# Patient Record
Sex: Male | Born: 1975 | Race: White | Hispanic: No | Marital: Single
Health system: Southern US, Community
[De-identification: ages and names within clinical notes are randomized; demographics above are authoritative.]

## PROBLEM LIST (undated history)

## (undated) DIAGNOSIS — K469 Unspecified abdominal hernia without obstruction or gangrene: Secondary | ICD-10-CM

---

## 1998-01-16 ENCOUNTER — Emergency Department (HOSPITAL_COMMUNITY): Admission: EM | Admit: 1998-01-16 | Discharge: 1998-01-17 | Payer: Self-pay | Admitting: Emergency Medicine

## 1998-05-01 ENCOUNTER — Emergency Department (HOSPITAL_COMMUNITY): Admission: EM | Admit: 1998-05-01 | Discharge: 1998-05-01 | Payer: Self-pay

## 1998-06-14 ENCOUNTER — Emergency Department (HOSPITAL_COMMUNITY): Admission: EM | Admit: 1998-06-14 | Discharge: 1998-06-15 | Payer: Self-pay | Admitting: Emergency Medicine

## 1998-07-26 ENCOUNTER — Emergency Department (HOSPITAL_COMMUNITY): Admission: EM | Admit: 1998-07-26 | Discharge: 1998-07-26 | Payer: Self-pay

## 1998-08-06 ENCOUNTER — Emergency Department (HOSPITAL_COMMUNITY): Admission: EM | Admit: 1998-08-06 | Discharge: 1998-08-06 | Payer: Self-pay | Admitting: Emergency Medicine

## 1999-02-26 ENCOUNTER — Encounter: Payer: Self-pay | Admitting: Emergency Medicine

## 1999-02-26 ENCOUNTER — Emergency Department (HOSPITAL_COMMUNITY): Admission: EM | Admit: 1999-02-26 | Discharge: 1999-02-26 | Payer: Self-pay | Admitting: Emergency Medicine

## 1999-10-29 ENCOUNTER — Emergency Department (HOSPITAL_COMMUNITY): Admission: EM | Admit: 1999-10-29 | Discharge: 1999-10-29 | Payer: Self-pay | Admitting: Emergency Medicine

## 2000-04-02 ENCOUNTER — Emergency Department (HOSPITAL_COMMUNITY): Admission: EM | Admit: 2000-04-02 | Discharge: 2000-04-02 | Payer: Self-pay | Admitting: Emergency Medicine

## 2000-04-02 ENCOUNTER — Encounter: Payer: Self-pay | Admitting: Emergency Medicine

## 2000-05-17 ENCOUNTER — Emergency Department (HOSPITAL_COMMUNITY): Admission: EM | Admit: 2000-05-17 | Discharge: 2000-05-17 | Payer: Self-pay | Admitting: Emergency Medicine

## 2001-09-23 ENCOUNTER — Emergency Department (HOSPITAL_COMMUNITY): Admission: EM | Admit: 2001-09-23 | Discharge: 2001-09-23 | Payer: Self-pay

## 2001-12-20 ENCOUNTER — Encounter: Payer: Self-pay | Admitting: Emergency Medicine

## 2001-12-20 ENCOUNTER — Emergency Department (HOSPITAL_COMMUNITY): Admission: EM | Admit: 2001-12-20 | Discharge: 2001-12-20 | Payer: Self-pay | Admitting: Emergency Medicine

## 2004-05-25 ENCOUNTER — Emergency Department (HOSPITAL_COMMUNITY): Admission: EM | Admit: 2004-05-25 | Discharge: 2004-05-25 | Payer: Self-pay | Admitting: Emergency Medicine

## 2004-10-21 ENCOUNTER — Emergency Department (HOSPITAL_COMMUNITY): Admission: EM | Admit: 2004-10-21 | Discharge: 2004-10-21 | Payer: Self-pay | Admitting: Emergency Medicine

## 2005-05-22 ENCOUNTER — Emergency Department (HOSPITAL_COMMUNITY): Admission: EM | Admit: 2005-05-22 | Discharge: 2005-05-22 | Payer: Self-pay | Admitting: *Deleted

## 2006-08-09 ENCOUNTER — Emergency Department (HOSPITAL_COMMUNITY): Admission: EM | Admit: 2006-08-09 | Discharge: 2006-08-09 | Payer: Self-pay | Admitting: Emergency Medicine

## 2007-10-25 ENCOUNTER — Emergency Department (HOSPITAL_COMMUNITY): Admission: EM | Admit: 2007-10-25 | Discharge: 2007-10-25 | Payer: Self-pay | Admitting: Emergency Medicine

## 2008-01-30 ENCOUNTER — Emergency Department (HOSPITAL_COMMUNITY): Admission: EM | Admit: 2008-01-30 | Discharge: 2008-01-30 | Payer: Self-pay | Admitting: Emergency Medicine

## 2008-02-09 ENCOUNTER — Emergency Department (HOSPITAL_COMMUNITY): Admission: EM | Admit: 2008-02-09 | Discharge: 2008-02-09 | Payer: Self-pay | Admitting: Emergency Medicine

## 2008-06-25 ENCOUNTER — Emergency Department (HOSPITAL_COMMUNITY): Admission: EM | Admit: 2008-06-25 | Discharge: 2008-06-25 | Payer: Self-pay | Admitting: Gastroenterology

## 2008-07-22 ENCOUNTER — Emergency Department (HOSPITAL_COMMUNITY): Admission: EM | Admit: 2008-07-22 | Discharge: 2008-07-22 | Payer: Self-pay | Admitting: Emergency Medicine

## 2008-08-22 ENCOUNTER — Emergency Department (HOSPITAL_COMMUNITY): Admission: EM | Admit: 2008-08-22 | Discharge: 2008-08-22 | Payer: Self-pay | Admitting: Emergency Medicine

## 2008-09-09 ENCOUNTER — Emergency Department (HOSPITAL_COMMUNITY): Admission: EM | Admit: 2008-09-09 | Discharge: 2008-09-09 | Payer: Self-pay | Admitting: Emergency Medicine

## 2008-09-28 ENCOUNTER — Emergency Department (HOSPITAL_COMMUNITY): Admission: EM | Admit: 2008-09-28 | Discharge: 2008-09-28 | Payer: Self-pay | Admitting: Emergency Medicine

## 2009-04-30 ENCOUNTER — Emergency Department (HOSPITAL_COMMUNITY): Admission: EM | Admit: 2009-04-30 | Discharge: 2009-04-30 | Payer: Self-pay | Admitting: Emergency Medicine

## 2010-03-23 ENCOUNTER — Emergency Department (HOSPITAL_COMMUNITY): Admission: EM | Admit: 2010-03-23 | Discharge: 2010-03-23 | Payer: Self-pay | Admitting: Emergency Medicine

## 2010-10-06 ENCOUNTER — Emergency Department (HOSPITAL_COMMUNITY): Payer: Self-pay

## 2010-10-06 ENCOUNTER — Emergency Department (HOSPITAL_COMMUNITY)
Admission: EM | Admit: 2010-10-06 | Discharge: 2010-10-06 | Disposition: A | Discharge status: Home / Self Care | Payer: Self-pay | Attending: Emergency Medicine | Admitting: Emergency Medicine

## 2010-10-06 DIAGNOSIS — J069 Acute upper respiratory infection, unspecified: Secondary | ICD-10-CM | POA: Insufficient documentation

## 2010-10-06 DIAGNOSIS — R5381 Other malaise: Secondary | ICD-10-CM | POA: Insufficient documentation

## 2010-10-06 DIAGNOSIS — R070 Pain in throat: Secondary | ICD-10-CM | POA: Insufficient documentation

## 2010-10-06 DIAGNOSIS — R197 Diarrhea, unspecified: Secondary | ICD-10-CM | POA: Insufficient documentation

## 2010-10-06 DIAGNOSIS — R0602 Shortness of breath: Secondary | ICD-10-CM | POA: Insufficient documentation

## 2010-10-06 DIAGNOSIS — R05 Cough: Secondary | ICD-10-CM | POA: Insufficient documentation

## 2010-10-06 DIAGNOSIS — R509 Fever, unspecified: Secondary | ICD-10-CM | POA: Insufficient documentation

## 2010-10-06 DIAGNOSIS — R599 Enlarged lymph nodes, unspecified: Secondary | ICD-10-CM | POA: Insufficient documentation

## 2010-10-06 DIAGNOSIS — R1110 Vomiting, unspecified: Secondary | ICD-10-CM | POA: Insufficient documentation

## 2010-10-06 DIAGNOSIS — J3489 Other specified disorders of nose and nasal sinuses: Secondary | ICD-10-CM | POA: Insufficient documentation

## 2010-10-06 DIAGNOSIS — R059 Cough, unspecified: Secondary | ICD-10-CM | POA: Insufficient documentation

## 2010-10-06 DIAGNOSIS — R5383 Other fatigue: Secondary | ICD-10-CM | POA: Insufficient documentation

## 2010-10-06 LAB — RAPID STREP SCREEN (MED CTR MEBANE ONLY): Streptococcus, Group A Screen (Direct): NEGATIVE

## 2010-11-10 ENCOUNTER — Emergency Department (HOSPITAL_COMMUNITY): Payer: Self-pay

## 2010-11-10 ENCOUNTER — Emergency Department (HOSPITAL_COMMUNITY)
Admission: EM | Admit: 2010-11-10 | Discharge: 2010-11-10 | Disposition: A | Discharge status: Home / Self Care | Payer: Self-pay | Attending: Emergency Medicine | Admitting: Emergency Medicine

## 2010-11-10 DIAGNOSIS — W108XXA Fall (on) (from) other stairs and steps, initial encounter: Secondary | ICD-10-CM | POA: Insufficient documentation

## 2010-11-10 DIAGNOSIS — S20229A Contusion of unspecified back wall of thorax, initial encounter: Secondary | ICD-10-CM | POA: Insufficient documentation

## 2010-11-10 DIAGNOSIS — S335XXA Sprain of ligaments of lumbar spine, initial encounter: Secondary | ICD-10-CM | POA: Insufficient documentation

## 2010-11-10 DIAGNOSIS — Y92009 Unspecified place in unspecified non-institutional (private) residence as the place of occurrence of the external cause: Secondary | ICD-10-CM | POA: Insufficient documentation

## 2010-11-15 ENCOUNTER — Emergency Department (HOSPITAL_COMMUNITY)
Admission: EM | Admit: 2010-11-15 | Discharge: 2010-11-15 | Discharge status: Against Medical Advice | Payer: No Typology Code available for payment source | Attending: Emergency Medicine | Admitting: Emergency Medicine

## 2010-11-15 DIAGNOSIS — Z0389 Encounter for observation for other suspected diseases and conditions ruled out: Secondary | ICD-10-CM | POA: Insufficient documentation

## 2010-11-16 ENCOUNTER — Emergency Department (HOSPITAL_COMMUNITY): Payer: No Typology Code available for payment source

## 2010-11-16 ENCOUNTER — Emergency Department (HOSPITAL_COMMUNITY)
Admission: EM | Admit: 2010-11-16 | Discharge: 2010-11-16 | Disposition: A | Discharge status: Home / Self Care | Payer: No Typology Code available for payment source | Attending: Emergency Medicine | Admitting: Emergency Medicine

## 2010-11-16 DIAGNOSIS — Y92410 Unspecified street and highway as the place of occurrence of the external cause: Secondary | ICD-10-CM | POA: Insufficient documentation

## 2010-11-16 DIAGNOSIS — S62639A Displaced fracture of distal phalanx of unspecified finger, initial encounter for closed fracture: Secondary | ICD-10-CM | POA: Insufficient documentation

## 2010-12-03 LAB — URINALYSIS, ROUTINE W REFLEX MICROSCOPIC
Bilirubin Urine: NEGATIVE
Glucose, UA: NEGATIVE mg/dL
Hgb urine dipstick: NEGATIVE
Ketones, ur: NEGATIVE mg/dL
Nitrite: NEGATIVE
Specific Gravity, Urine: 1.006 (ref 1.005–1.030)
pH: 6 (ref 5.0–8.0)

## 2011-02-16 ENCOUNTER — Emergency Department (HOSPITAL_COMMUNITY)
Admission: EM | Admit: 2011-02-16 | Discharge: 2011-02-16 | Disposition: A | Discharge status: Home / Self Care | Payer: Self-pay | Attending: Emergency Medicine | Admitting: Emergency Medicine

## 2011-02-16 ENCOUNTER — Emergency Department (HOSPITAL_COMMUNITY): Payer: Self-pay

## 2011-02-16 DIAGNOSIS — Y9318 Activity, surfing, windsurfing and boogie boarding: Secondary | ICD-10-CM | POA: Insufficient documentation

## 2011-02-16 DIAGNOSIS — IMO0002 Reserved for concepts with insufficient information to code with codable children: Secondary | ICD-10-CM | POA: Insufficient documentation

## 2011-02-16 DIAGNOSIS — X58XXXA Exposure to other specified factors, initial encounter: Secondary | ICD-10-CM | POA: Insufficient documentation

## 2011-02-16 DIAGNOSIS — M25519 Pain in unspecified shoulder: Secondary | ICD-10-CM | POA: Insufficient documentation

## 2011-03-01 ENCOUNTER — Emergency Department (HOSPITAL_COMMUNITY)
Admission: EM | Admit: 2011-03-01 | Discharge: 2011-03-01 | Disposition: A | Discharge status: Home / Self Care | Payer: Self-pay | Attending: Emergency Medicine | Admitting: Emergency Medicine

## 2011-03-01 DIAGNOSIS — M25519 Pain in unspecified shoulder: Secondary | ICD-10-CM | POA: Insufficient documentation

## 2011-03-07 ENCOUNTER — Other Ambulatory Visit: Payer: Self-pay | Admitting: Orthopedic Surgery

## 2011-03-07 DIAGNOSIS — M25511 Pain in right shoulder: Secondary | ICD-10-CM

## 2011-03-21 ENCOUNTER — Other Ambulatory Visit: Payer: Self-pay | Admitting: Orthopedic Surgery

## 2011-03-21 DIAGNOSIS — M25511 Pain in right shoulder: Secondary | ICD-10-CM

## 2011-03-24 ENCOUNTER — Other Ambulatory Visit: Payer: Self-pay

## 2011-03-24 ENCOUNTER — Inpatient Hospital Stay: Admission: RE | Admit: 2011-03-24 | Payer: Self-pay | Source: Ambulatory Visit

## 2011-10-24 ENCOUNTER — Emergency Department (HOSPITAL_COMMUNITY)
Admission: EM | Admit: 2011-10-24 | Discharge: 2011-10-24 | Disposition: A | Discharge status: Home / Self Care | Payer: Self-pay | Attending: Emergency Medicine | Admitting: Emergency Medicine

## 2011-10-24 ENCOUNTER — Emergency Department (HOSPITAL_COMMUNITY): Payer: Self-pay

## 2011-10-24 ENCOUNTER — Encounter (HOSPITAL_COMMUNITY): Payer: Self-pay

## 2011-10-24 DIAGNOSIS — R404 Transient alteration of awareness: Secondary | ICD-10-CM | POA: Insufficient documentation

## 2011-10-24 DIAGNOSIS — R609 Edema, unspecified: Secondary | ICD-10-CM | POA: Insufficient documentation

## 2011-10-24 DIAGNOSIS — S42009A Fracture of unspecified part of unspecified clavicle, initial encounter for closed fracture: Secondary | ICD-10-CM | POA: Insufficient documentation

## 2011-10-24 DIAGNOSIS — R1110 Vomiting, unspecified: Secondary | ICD-10-CM | POA: Insufficient documentation

## 2011-10-24 DIAGNOSIS — R51 Headache: Secondary | ICD-10-CM | POA: Insufficient documentation

## 2011-10-24 DIAGNOSIS — M79646 Pain in unspecified finger(s): Secondary | ICD-10-CM

## 2011-10-24 DIAGNOSIS — J3489 Other specified disorders of nose and nasal sinuses: Secondary | ICD-10-CM | POA: Insufficient documentation

## 2011-10-24 DIAGNOSIS — IMO0002 Reserved for concepts with insufficient information to code with codable children: Secondary | ICD-10-CM | POA: Insufficient documentation

## 2011-10-24 DIAGNOSIS — M79609 Pain in unspecified limb: Secondary | ICD-10-CM | POA: Insufficient documentation

## 2011-10-24 HISTORY — DX: Unspecified abdominal hernia without obstruction or gangrene: K46.9

## 2011-10-24 MED ORDER — OXYCODONE-ACETAMINOPHEN 5-325 MG PO TABS
1.0000 | ORAL_TABLET | Freq: Once | ORAL | Status: AC
  Administered 2011-10-24: 1 via ORAL
  Filled 2011-10-24: qty 1

## 2011-10-24 MED ORDER — IBUPROFEN 800 MG PO TABS
800.0000 mg | ORAL_TABLET | Freq: Once | ORAL | Status: AC
  Administered 2011-10-24: 800 mg via ORAL
  Filled 2011-10-24: qty 1

## 2011-10-24 MED ORDER — OXYCODONE-ACETAMINOPHEN 5-325 MG PO TABS: 2.0000 | ORAL_TABLET | ORAL | Status: AC | PRN

## 2011-10-24 NOTE — Discharge Instructions (Signed)
Mr Brandon Flowers the x-rays today show a fractured clavicle.  The CT of the head did not show any acute findings. Follow up with the orthopedic physician listed.  Keep ice on the area for the next 24 hours.  Return for severe pain, SOB or high fever/nv.      Clavicle Fracture A clavicle fracture is a broken clavicle (collarbone). The clavicle connects the chest to the shoulder. Most broken clavicles are treated with an arm sling. HOME CARE  Put ice on the injured area.   Put ice in a plastic bag.   Place a towel between the skin and the bag.   Leave the ice on for 15 to 20 minutes, 3 to 4 times a day. Do this for the first 2 days.   Wear the sling or splint for as long as told by your doctor. You may remove the sling or splint for bathing or showering. Keep the shoulder in the same place as when the sling or splint is on. Do not lift the arm.   Allow enough room to place the index finger between the body and strap of the splint. Loosen the splint right away if you lose feeling (numbness) or have tingling in the hands.   Only take medicine as told by your doctor.   Avoid activities that increase pain for 4 to 6 weeks, or as told by your doctor.  GET HELP RIGHT AWAY IF:   There is pain and puffiness (swelling) that is not helped with medicine.   The arm is numb, cold, or pale, even when the sling or splint is loose.  MAKE SURE YOU:   Understand these instructions.   Will watch this condition.   Will get help right away if you or your child is not doing well or gets worse.  Document Released: 01/21/2008 Document Revised: 07/24/2011 Document Reviewed: 05/22/2009 [facility] Patient Information 2012 [place], [place].
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
## 2011-10-24 NOTE — ED Notes (Signed)
NP at bedside.

## 2011-10-25 NOTE — ED Provider Notes (Signed)
Medical screening examination/treatment/procedure(s) were performed by non-physician practitioner and as supervising physician I was immediately available for consultation/collaboration.   Forbes Cellar, MD 10/25/11 1555

## 2012-10-19 ENCOUNTER — Emergency Department (HOSPITAL_COMMUNITY)
Admission: EM | Admit: 2012-10-19 | Discharge: 2012-10-19 | Disposition: A | Discharge status: Home / Self Care | Payer: Self-pay | Attending: Emergency Medicine | Admitting: Emergency Medicine

## 2012-10-19 ENCOUNTER — Emergency Department (HOSPITAL_COMMUNITY): Payer: Self-pay

## 2012-10-19 ENCOUNTER — Encounter (HOSPITAL_COMMUNITY): Payer: Self-pay | Admitting: Emergency Medicine

## 2012-10-19 DIAGNOSIS — Y929 Unspecified place or not applicable: Secondary | ICD-10-CM | POA: Insufficient documentation

## 2012-10-19 DIAGNOSIS — R229 Localized swelling, mass and lump, unspecified: Secondary | ICD-10-CM | POA: Insufficient documentation

## 2012-10-19 DIAGNOSIS — Y9389 Activity, other specified: Secondary | ICD-10-CM | POA: Insufficient documentation

## 2012-10-19 DIAGNOSIS — Z8719 Personal history of other diseases of the digestive system: Secondary | ICD-10-CM | POA: Insufficient documentation

## 2012-10-19 DIAGNOSIS — Z23 Encounter for immunization: Secondary | ICD-10-CM | POA: Insufficient documentation

## 2012-10-19 DIAGNOSIS — F17200 Nicotine dependence, unspecified, uncomplicated: Secondary | ICD-10-CM | POA: Insufficient documentation

## 2012-10-19 DIAGNOSIS — M795 Residual foreign body in soft tissue: Secondary | ICD-10-CM

## 2012-10-19 DIAGNOSIS — IMO0002 Reserved for concepts with insufficient information to code with codable children: Secondary | ICD-10-CM | POA: Insufficient documentation

## 2012-10-19 MED ORDER — CEPHALEXIN 500 MG PO CAPS: 500.0000 mg | ORAL_CAPSULE | Freq: Four times a day (QID) | ORAL | Status: DC

## 2012-10-19 MED ORDER — CEPHALEXIN 500 MG PO CAPS
1000.0000 mg | ORAL_CAPSULE | Freq: Once | ORAL | Status: AC
  Administered 2012-10-19: 1000 mg via ORAL
  Filled 2012-10-19: qty 2

## 2012-10-19 MED ORDER — TETANUS-DIPHTH-ACELL PERTUSSIS 5-2.5-18.5 LF-MCG/0.5 IM SUSP
0.5000 mL | Freq: Once | INTRAMUSCULAR | Status: AC
  Administered 2012-10-19: 0.5 mL via INTRAMUSCULAR
  Filled 2012-10-19: qty 0.5

## 2012-10-19 MED ORDER — TRAMADOL HCL 50 MG PO TABS: 50.0000 mg | ORAL_TABLET | Freq: Four times a day (QID) | ORAL | Status: DC | PRN

## 2012-10-19 NOTE — ED Provider Notes (Signed)
History  This chart was scribed for non-physician practitioner working with Flint Melter, MD by Ardeen Jourdain, ED Scribe. This patient was seen in room WTR5/WTR5 and the patient's care was started at 1555.  CSN: [id number]  Arrival date & time 10/19/12  1539   First MD Initiated Contact with Patient 10/19/12 1555      No chief complaint on file.    The history is provided by the patient. No language interpreter was used.    Brandon Flowers is a 37 y.o. male who presents to the Emergency Department complaining of pain and possible foreign body embedded in the left forearm. Patient was working on his car and hitting one hammer onto another when he middle broke and he thinks that a piece of it is embedded in the left forearm. This happened 4 days ago. He denies any decreased range of motion, fever, chills, nausea, vomiting, he endorses a shooting pain that radiates up the arm from the affected site.    Past Medical History  Diagnosis Date  . Hernia   . Hernia     No past surgical history on file.  No family history on file.  History  Substance Use Topics  . Smoking status: Current Every Day Smoker -- 1.00 packs/day  . Smokeless tobacco: Not on file  . Alcohol Use: Yes     Comment: occasionally      Review of Systems  Constitutional: Negative for fever and chills.  Respiratory: Negative for shortness of breath.   Cardiovascular: Negative for chest pain.  Gastrointestinal: Negative for nausea, vomiting, abdominal pain and diarrhea.  Skin: Positive for wound.  Neurological: Negative for weakness.  All other systems reviewed and are negative.    Allergies  Amoxicillin and Penicillins  Home Medications   Current Outpatient Rx  Name  Route  Sig  Dispense  Refill  . acetaminophen (TYLENOL) 325 MG tablet   Oral   Take 650 mg by mouth every 6 (six) hours as needed for pain.           There were no vitals taken for this visit.  Physical Exam  Nursing note  and vitals reviewed. Constitutional: He is oriented to person, place, and time. He appears well-developed and well-nourished. No distress.  HENT:  Head: Normocephalic.  Eyes: Conjunctivae and EOM are normal.  Cardiovascular: Normal rate.   Pulmonary/Chest: Effort normal. No stridor.  Musculoskeletal: Normal range of motion.       Arms: Neurological: He is alert and oriented to person, place, and time.  Psychiatric: He has a normal mood and affect.    ED Course  Procedures (including critical care time)    COORDINATION OF CARE:    Labs Reviewed - No data to display Dg Wrist Complete Left  10/19/2012  *RADIOLOGY REPORT*  Clinical Data: History of having lateral with sensation of foreign body.  LEFT WRIST - COMPLETE 3+ VIEW  Comparison: 02/09/2008.  Findings: There is a 3 x 3 x 1 mm metallic density foreign body seen in the soft tissues dorsal to the distal radial diaphysis at junction with metaphysis.  No bony lesion is identified.  Joint spaces are preserved.  Alignment is normal.  IMPRESSION: Small metallic foreign body is seen within the soft tissues dorsal to the distal radial diaphysis at junction with metaphysis.   Original Report Authenticated By: Onalee Hua Call      1. Foreign body (FB) in soft tissue       MDM  Patient has  good range of motion no sign of any tendon involvement. There is no overt infection.  Metal shard is too deep for removal. tDap updated. Abx started, Hand referral given/   Filed Vitals:   10/19/12 1740  BP: 136/79  Pulse: 76  Temp: 98.4 F (36.9 C)  TempSrc: Oral  Resp: 20  SpO2: 98%     Pt verbalized understanding and agrees with care plan. Outpatient follow-up and return precautions given.    New Prescriptions   CEPHALEXIN (KEFLEX) 500 MG CAPSULE    Take 1 capsule (500 mg total) by mouth 4 (four) times daily.   TRAMADOL (ULTRAM) 50 MG TABLET    Take 1 tablet (50 mg total) by mouth every 6 (six) hours as needed for pain.    I personally  performed the services described in this documentation, which was scribed in my presence. The recorded information has been reviewed and is accurate.    Wynetta Emery, PA-C 10/19/12 2006

## 2012-10-19 NOTE — ED Provider Notes (Signed)
Medical screening examination/treatment/procedure(s) were performed by non-physician practitioner and as supervising physician I was immediately available for consultation/collaboration.   Flint Melter, MD 10/19/12 2257

## 2012-10-19 NOTE — ED Notes (Signed)
Pt was working with some hammers and thinks a metal went into his L arm. Full ROM noted.

## 2014-04-10 ENCOUNTER — Emergency Department (HOSPITAL_COMMUNITY)
Admission: EM | Admit: 2014-04-10 | Discharge: 2014-04-10 | Disposition: A | Discharge status: Home / Self Care | Payer: No Typology Code available for payment source | Attending: Emergency Medicine | Admitting: Emergency Medicine

## 2014-04-10 ENCOUNTER — Emergency Department (HOSPITAL_COMMUNITY): Payer: No Typology Code available for payment source

## 2014-04-10 ENCOUNTER — Encounter (HOSPITAL_COMMUNITY): Payer: Self-pay | Admitting: Emergency Medicine

## 2014-04-10 DIAGNOSIS — Y9389 Activity, other specified: Secondary | ICD-10-CM | POA: Diagnosis not present

## 2014-04-10 DIAGNOSIS — Z8719 Personal history of other diseases of the digestive system: Secondary | ICD-10-CM | POA: Insufficient documentation

## 2014-04-10 DIAGNOSIS — Z880 Allergy status to penicillin: Secondary | ICD-10-CM | POA: Diagnosis not present

## 2014-04-10 DIAGNOSIS — S46909A Unspecified injury of unspecified muscle, fascia and tendon at shoulder and upper arm level, unspecified arm, initial encounter: Secondary | ICD-10-CM | POA: Insufficient documentation

## 2014-04-10 DIAGNOSIS — S4980XA Other specified injuries of shoulder and upper arm, unspecified arm, initial encounter: Secondary | ICD-10-CM | POA: Insufficient documentation

## 2014-04-10 DIAGNOSIS — Y92410 Unspecified street and highway as the place of occurrence of the external cause: Secondary | ICD-10-CM | POA: Diagnosis not present

## 2014-04-10 DIAGNOSIS — S61209A Unspecified open wound of unspecified finger without damage to nail, initial encounter: Secondary | ICD-10-CM | POA: Insufficient documentation

## 2014-04-10 DIAGNOSIS — S6990XA Unspecified injury of unspecified wrist, hand and finger(s), initial encounter: Secondary | ICD-10-CM | POA: Diagnosis not present

## 2014-04-10 DIAGNOSIS — S59919A Unspecified injury of unspecified forearm, initial encounter: Secondary | ICD-10-CM

## 2014-04-10 DIAGNOSIS — F17200 Nicotine dependence, unspecified, uncomplicated: Secondary | ICD-10-CM | POA: Insufficient documentation

## 2014-04-10 DIAGNOSIS — S59909A Unspecified injury of unspecified elbow, initial encounter: Secondary | ICD-10-CM | POA: Diagnosis not present

## 2014-04-10 MED ORDER — HYDROCODONE-ACETAMINOPHEN 5-325 MG PO TABS: 1.0000 | ORAL_TABLET | Freq: Four times a day (QID) | ORAL | Status: DC | PRN

## 2014-04-10 MED ORDER — CYCLOBENZAPRINE HCL 10 MG PO TABS: 10.0000 mg | ORAL_TABLET | Freq: Two times a day (BID) | ORAL | Status: DC | PRN

## 2014-04-10 NOTE — Discharge Instructions (Signed)
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
## 2014-04-11 NOTE — ED Provider Notes (Signed)
Medical screening examination/treatment/procedure(s) were performed by non-physician practitioner and as supervising physician I was immediately available for consultation/collaboration.   EKG Interpretation None        Mirian Mo, MD 04/11/14 [phone]

## 2015-02-09 ENCOUNTER — Encounter (HOSPITAL_COMMUNITY): Payer: Self-pay | Admitting: *Deleted

## 2015-02-09 ENCOUNTER — Emergency Department (HOSPITAL_COMMUNITY)
Admission: EM | Admit: 2015-02-09 | Discharge: 2015-02-09 | Disposition: A | Discharge status: Home / Self Care | Payer: Self-pay | Attending: Emergency Medicine | Admitting: Emergency Medicine

## 2015-02-09 ENCOUNTER — Emergency Department (HOSPITAL_COMMUNITY): Payer: Self-pay

## 2015-02-09 DIAGNOSIS — R112 Nausea with vomiting, unspecified: Secondary | ICD-10-CM

## 2015-02-09 DIAGNOSIS — Z880 Allergy status to penicillin: Secondary | ICD-10-CM | POA: Insufficient documentation

## 2015-02-09 DIAGNOSIS — Z720 Tobacco use: Secondary | ICD-10-CM | POA: Insufficient documentation

## 2015-02-09 DIAGNOSIS — K2920 Alcoholic gastritis without bleeding: Secondary | ICD-10-CM | POA: Insufficient documentation

## 2015-02-09 LAB — COMPREHENSIVE METABOLIC PANEL
ALK PHOS: 65 U/L (ref 38–126)
ALT: 24 U/L (ref 17–63)
AST: 30 U/L (ref 15–41)
Albumin: 4.8 g/dL (ref 3.5–5.0)
Anion gap: 12 (ref 5–15)
BUN: 17 mg/dL (ref 6–20)
CHLORIDE: 96 mmol/L — AB (ref 101–111)
CO2: 34 mmol/L — AB (ref 22–32)
Calcium: 10.6 mg/dL — ABNORMAL HIGH (ref 8.9–10.3)
Creatinine, Ser: 1.01 mg/dL (ref 0.61–1.24)
GLUCOSE: 118 mg/dL — AB (ref 65–99)
POTASSIUM: 3.6 mmol/L (ref 3.5–5.1)
SODIUM: 142 mmol/L (ref 135–145)
TOTAL PROTEIN: 8 g/dL (ref 6.5–8.1)
Total Bilirubin: 0.7 mg/dL (ref 0.3–1.2)

## 2015-02-09 LAB — CBC WITH DIFFERENTIAL/PLATELET
BASOS PCT: 0 % (ref 0–1)
Basophils Absolute: 0 10*3/uL (ref 0.0–0.1)
EOS ABS: 0.1 10*3/uL (ref 0.0–0.7)
Eosinophils Relative: 1 % (ref 0–5)
HCT: 41.3 % (ref 39.0–52.0)
Hemoglobin: 14.1 g/dL (ref 13.0–17.0)
LYMPHS ABS: 1.3 10*3/uL (ref 0.7–4.0)
Lymphocytes Relative: 14 % (ref 12–46)
MCH: 33.1 pg (ref 26.0–34.0)
MCHC: 34.1 g/dL (ref 30.0–36.0)
MCV: 96.9 fL (ref 78.0–100.0)
MONOS PCT: 6 % (ref 3–12)
Monocytes Absolute: 0.6 10*3/uL (ref 0.1–1.0)
NEUTROS ABS: 7.3 10*3/uL (ref 1.7–7.7)
NEUTROS PCT: 79 % — AB (ref 43–77)
PLATELETS: 242 10*3/uL (ref 150–400)
RBC: 4.26 MIL/uL (ref 4.22–5.81)
RDW: 12.9 % (ref 11.5–15.5)
WBC: 9.4 10*3/uL (ref 4.0–10.5)

## 2015-02-09 LAB — I-STAT TROPONIN, ED: TROPONIN I, POC: 0.01 ng/mL (ref 0.00–0.08)

## 2015-02-09 LAB — LIPASE, BLOOD: LIPASE: 21 U/L — AB (ref 22–51)

## 2015-02-09 MED ORDER — PANTOPRAZOLE SODIUM 40 MG PO TBEC
40.0000 mg | DELAYED_RELEASE_TABLET | Freq: Once | ORAL | Status: AC
  Administered 2015-02-09: 40 mg via ORAL
  Filled 2015-02-09: qty 1

## 2015-02-09 MED ORDER — SUCRALFATE 1 G PO TABS: 1.0000 g | ORAL_TABLET | Freq: Three times a day (TID) | ORAL | Status: DC

## 2015-02-09 MED ORDER — HYDROCODONE-ACETAMINOPHEN 5-325 MG PO TABS
2.0000 | ORAL_TABLET | Freq: Once | ORAL | Status: AC
  Administered 2015-02-09: 2 via ORAL
  Filled 2015-02-09: qty 2

## 2015-02-09 MED ORDER — PROMETHAZINE HCL 25 MG PO TABS: 25.0000 mg | ORAL_TABLET | Freq: Four times a day (QID) | ORAL | Status: DC | PRN

## 2015-02-09 MED ORDER — OMEPRAZOLE 40 MG PO CPDR: 40.0000 mg | DELAYED_RELEASE_CAPSULE | Freq: Every day | ORAL | Status: DC

## 2015-02-09 MED ORDER — GI COCKTAIL ~~LOC~~
30.0000 mL | Freq: Once | ORAL | Status: AC
  Administered 2015-02-09: 30 mL via ORAL
  Filled 2015-02-09: qty 30

## 2015-02-09 MED ORDER — ONDANSETRON 4 MG PO TBDP
4.0000 mg | ORAL_TABLET | Freq: Once | ORAL | Status: AC
  Administered 2015-02-09: 4 mg via ORAL
  Filled 2015-02-09: qty 1

## 2015-02-09 NOTE — Discharge Instructions (Signed)
Gastritis, Adult Gastritis is soreness and swelling (inflammation) of the lining of the stomach. Gastritis can develop as a sudden onset (acute) or long-term (chronic) condition. If gastritis is not treated, it can lead to stomach bleeding and ulcers. CAUSES  Gastritis occurs when the stomach lining is weak or damaged. Digestive juices from the stomach then inflame the weakened stomach lining. The stomach lining may be weak or damaged due to viral or bacterial infections. One common bacterial infection is the Helicobacter pylori infection. Gastritis can also result from excessive alcohol consumption, taking certain medicines, or having too much acid in the stomach.  SYMPTOMS  In some cases, there are no symptoms. When symptoms are present, they may include:  Pain or a burning sensation in the upper abdomen.  Nausea.  Vomiting.  An uncomfortable feeling of fullness after eating. DIAGNOSIS  Your caregiver may suspect you have gastritis based on your symptoms and a physical exam. To determine the cause of your gastritis, your caregiver may perform the following:  Blood or stool tests to check for the H pylori bacterium.  Gastroscopy. A thin, flexible tube (endoscope) is passed down the esophagus and into the stomach. The endoscope has a light and camera on the end. Your caregiver uses the endoscope to view the inside of the stomach.  Taking a tissue sample (biopsy) from the stomach to examine under a microscope. TREATMENT  Depending on the cause of your gastritis, medicines may be prescribed. If you have a bacterial infection, such as an H pylori infection, antibiotics may be given. If your gastritis is caused by too much acid in the stomach, H2 blockers or antacids may be given. Your caregiver may recommend that you stop taking aspirin, ibuprofen, or other nonsteroidal anti-inflammatory drugs (NSAIDs). HOME CARE INSTRUCTIONS  Only take over-the-counter or prescription medicines as directed by  your caregiver.  If you were given antibiotic medicines, take them as directed. Finish them even if you start to feel better.  Drink enough fluids to keep your urine clear or pale yellow.  Avoid foods and drinks that make your symptoms worse, such as:  Caffeine or alcoholic drinks.  Chocolate.  Peppermint or mint flavorings.  Garlic and onions.  Spicy foods.  Citrus fruits, such as oranges, lemons, or limes.  Tomato-based foods such as sauce, chili, salsa, and pizza.  Fried and fatty foods.  Eat small, frequent meals instead of large meals. SEEK IMMEDIATE MEDICAL CARE IF:   You have black or dark red stools.  You vomit blood or material that looks like coffee grounds.  You are unable to keep fluids down.  Your abdominal pain gets worse.  You have a fever.  You do not feel better after 1 week.  You have any other questions or concerns. MAKE SURE YOU:  Understand these instructions.  Will watch your condition.  Will get help right away if you are not doing well or get worse. Document Released: 07/29/2001 Document Revised: 02/03/2012 Document Reviewed: 09/17/2011 [facility] Patient Information 2015 [place], [place]. This information is not intended to replace advice given to you by your health care provider. Make sure you discuss any questions you have with your health care provider.   Alcohol Use Disorder Alcohol use disorder is a mental disorder. It is not a one-time incident of heavy drinking. Alcohol use disorder is the excessive and uncontrollable use of alcohol over time that leads to problems with functioning in one or more areas of daily living. People with this disorder risk harming themselves  and others when they drink to excess. Alcohol use disorder also can cause other mental disorders, such as mood and anxiety disorders, and serious physical problems. People with alcohol use disorder often misuse other drugs.  Alcohol use disorder is common and widespread.  Some people with this disorder drink alcohol to cope with or escape from negative life events. Others drink to relieve chronic pain or symptoms of mental illness. People with a family history of alcohol use disorder are at higher risk of losing control and using alcohol to excess.  SYMPTOMS  Signs and symptoms of alcohol use disorder may include the following:   Consumption ofalcohol inlarger amounts or over a longer period of time than intended.  Multiple unsuccessful attempts to cutdown or control alcohol use.   A great deal of time spent obtaining alcohol, using alcohol, or recovering from the effects of alcohol (hangover).  A strong desire or urge to use alcohol (cravings).   Continued use of alcohol despite problems at work, school, or home because of alcohol use.   Continued use of alcohol despite problems in relationships because of alcohol use.  Continued use of alcohol in situations when it is physically hazardous, such as driving a car.  Continued use of alcohol despite awareness of a physical or psychological problem that is likely related to alcohol use. Physical problems related to alcohol use can involve the brain, heart, liver, stomach, and intestines. Psychological problems related to alcohol use include intoxication, depression, anxiety, psychosis, delirium, and dementia.   The need for increased amounts of alcohol to achieve the same desired effect, or a decreased effect from the consumption of the same amount of alcohol (tolerance).  Withdrawal symptoms upon reducing or stopping alcohol use, or alcohol use to reduce or avoid withdrawal symptoms. Withdrawal symptoms include:  Racing heart.  Hand tremor.  Difficulty sleeping.  Nausea.  Vomiting.  Hallucinations.  Restlessness.  Seizures. DIAGNOSIS Alcohol use disorder is diagnosed through an assessment by your health care provider. Your health care provider may start by asking three or four questions to  screen for excessive or problematic alcohol use. To confirm a diagnosis of alcohol use disorder, at least two symptoms must be present within a 23-month period. The severity of alcohol use disorder depends on the number of symptoms:  Mild--two or three.  Moderate--four or five.  Severe--six or more. Your health care provider may perform a physical exam or use results from lab tests to see if you have physical problems resulting from alcohol use. Your health care provider may refer you to a mental health professional for evaluation. TREATMENT  Some people with alcohol use disorder are able to reduce their alcohol use to low-risk levels. Some people with alcohol use disorder need to quit drinking alcohol. When necessary, mental health professionals with specialized training in substance use treatment can help. Your health care provider can help you decide how severe your alcohol use disorder is and what type of treatment you need. The following forms of treatment are available:   Detoxification. Detoxification involves the use of prescription medicines to prevent alcohol withdrawal symptoms in the first week after quitting. This is important for people with a history of symptoms of withdrawal and for heavy drinkers who are likely to have withdrawal symptoms. Alcohol withdrawal can be dangerous and, in severe cases, cause death. Detoxification is usually provided in a hospital or in-patient substance use treatment facility.  Counseling or talk therapy. Talk therapy is provided by substance use treatment counselors. It  addresses the reasons people use alcohol and ways to keep them from drinking again. The goals of talk therapy are to help people with alcohol use disorder find healthy activities and ways to cope with life stress, to identify and avoid triggers for alcohol use, and to handle cravings, which can cause relapse.  Medicines.Different medicines can help treat alcohol use disorder through the  following actions:  Decrease alcohol cravings.  Decrease the positive reward response felt from alcohol use.  Produce an uncomfortable physical reaction when alcohol is used (aversion therapy).  Support groups. Support groups are run by people who have quit drinking. They provide emotional support, advice, and guidance. These forms of treatment are often combined. Some people with alcohol use disorder benefit from intensive combination treatment provided by specialized substance use treatment centers. Both inpatient and outpatient treatment programs are available. Document Released: 09/11/2004 Document Revised: 12/19/2013 Document Reviewed: 11/11/2012 [facility] Patient Information 2015 [place], [place]. This information is not intended to replace advice given to you by your health care provider. Make sure you discuss any questions you have with your health care provider.    Food Choices for Peptic Ulcer Disease When you have peptic ulcer disease, the foods you eat and your eating habits are very important. Choosing the right foods can help ease the discomfort of peptic ulcer disease. WHAT GENERAL GUIDELINES DO I NEED TO FOLLOW?  Choose fruits, vegetables, whole grains, and low-fat meat, fish, and poultry.   Keep a food diary to identify foods that cause symptoms.  Avoid foods that cause irritation or pain. These may be different for different people.  Eat frequent small meals instead of three large meals each day. The pain may be worse when your stomach is empty.  Avoid eating close to bedtime. WHAT FOODS ARE NOT RECOMMENDED? The following are some foods and drinks that may worsen your symptoms:  Black, white, and red pepper.  Hot sauce.  Chili peppers.  Chili powder.  Chocolate and cocoa.   Alcohol.  Tea, coffee, and cola (regular and decaffeinated). The items listed above may not be a complete list of foods and beverages to avoid. Contact your dietitian for more  information. Document Released: 10/27/2011 Document Revised: 08/09/2013 Document Reviewed: 06/08/2013 [facility] Patient Information 2015 [place], [place]. This information is not intended to replace advice given to you by your health care provider. Make sure you discuss any questions you have with your health care provider.     Emergency Department Resource Guide 1) Find a Doctor and Pay Out of Pocket Although you won't have to find out who is covered by your insurance plan, it is a good idea to ask around and get recommendations. You will then need to call the office and see if the doctor you have chosen will accept you as a new patient and what types of options they offer for patients who are self-pay. Some doctors offer discounts or will set up payment plans for their patients who do not have insurance, but you will need to ask so you aren't surprised when you get to your appointment.  2) Contact Your Local Health Department Not all health departments have doctors that can see patients for sick visits, but many do, so it is worth a call to see if yours does. If you don't know where your local health department is, you can check in your phone book. The CDC also has a tool to help you locate your state's health department, and many state websites also have  listings of all of their local health departments.  3) Find a Walk-in Clinic If your illness is not likely to be very severe or complicated, you may want to try a walk in clinic. These are popping up all over the country in pharmacies, drugstores, and shopping centers. They're usually staffed by nurse practitioners or physician assistants that have been trained to treat common illnesses and complaints. They're usually fairly quick and inexpensive. However, if you have serious medical issues or chronic medical problems, these are probably not your best option.  No Primary Care Doctor: - Call Health Connect at  [phone] - they can help you locate a  primary care doctor that  accepts your insurance, provides certain services, etc. - Physician Referral Service- [phone]  Chronic Pain Problems: Organization         Address  Phone   Notes  [facility]  [phone] Patients need to be referred by their primary care doctor.   Medication Assistance: Organization         Address  Phone   Notes  [facility] Medication [facility] [address] [place] [place], [place] [address] [phone] --Must be a resident of [facility] -- Must have NO insurance coverage whatsoever (no Medicaid/ Medicare, etc.) -- The pt. MUST have a primary care doctor that directs their care regularly and follows them in the community   [facility]  [phone]   [employer]  [phone]    Agencies that provide inexpensive medical care: Organization         Address  Phone   Notes  [facility]  [phone]   [facility]    [phone]   [facility] [address] [place], [place] [address] [phone]   [facility] [address] [place], [place] [phone]   Planned Parenthood    [phone]   [facility]    [phone]   [facility]  [address], [place] Phone:  [phone], Fax:  [phone] Hours of Operation:  9 am - 6 pm, M-F.  Also accepts Medicaid/Medicare and self-pay.  [facility]  [address], [place] Phone: [phone], Fax: [phone]. Hours of Operation:  8:30 am - 5:30 pm, M-F.  Also accepts Medicaid and self-pay.  [facility] [place] [address] [place] Phone: [phone]   [facility] [address] Natasha Bence [place], [place] [phone], Ext. [phone] Mondays & Thursdays: 7-9 AM.  First 15 patients are seen on a first come, first serve basis.    Medicaid-accepting [facility]  Providers:  Organization         Address  Phone   Notes  [facility] [address], [place] [phone] Also accepts self-pay patients.  [facility] [address] Laurell Josephs [place], [place]  [phone]   [facility] [address], [place] [phone]   [facility] [address] [place] [phone]   Renaye Rakers [address], [place]   [phone] Only accepts [place] Access [place] patients after they have their name applied to their card.   Self-Pay (no insurance) in [facility]:  [employer]  Notes  Sickle Cell Patients, [facility] [address] [place], [place] [phone]   [facility] [address] [place], [place] [phone]   [facility]  [address],  [phone]   [facility]/Dr. Osei-Bonsu  [address], [place] or [address], [place] [phone] Phone number for both [place] and [place] locations is the same.  [facility] [address] [place] [phone]   [facility] [address] [place] or [address] [phone]   [facility] [address], [place] [phone], phone; [phone], fax Sees patients 1st and 3rd Saturday of every month.  Must not qualify for public or private insurance (i.e. Medicaid, Medicare, [place] Health Choice, Veterans' Benefits)  Household income should be no more than 200% of the poverty level The clinic cannot treat you if you are pregnant or think you are pregnant  Sexually transmitted diseases are not treated at the clinic.    Dental Care: Organization         Address  Phone  Notes  [facility] [address] [place], [place] [phone] Accepts children up to age 74 who are enrolled in [place] or [place] Health Choice; pregnant women with a Medicaid card; and children who have applied for Medicaid or [place] Health Choice, but were declined, whose parents can pay a reduced fee at time of service.  [facility] Department of [facility]  [address], [place] [phone] Accepts children up to age 61 who are enrolled in [place] or [place] Health Choice; pregnant women with a Medicaid card; and children who have applied for Medicaid or [place] Health Choice, but were declined, whose parents can pay a reduced fee at time of service.  [facility]  [address] [place], [place] [phone] Patients are seen by appointment only. Walk-ins are not accepted. [facility] will see patients 3 years of age and older. Monday - Tuesday (8am-5pm) Most Wednesdays (8:30-5pm) $30 per visit, cash only  [facility] Adult Dental Access PRO[facility]  [address], [facility] [phone] Patients are seen by appointment only. Walk-ins are not accepted. [facility] will see patients 56 years of age and older. One Wednesday Evening (Monthly: Volunteer Based).  $30 per visit, cash only  [employer]  [phone] for adults; Children under age 3, call Graduate Pediatric Dentistry at [phone]. Children aged 52-14, please call [phone] to request a pediatric application.  Dental services are provided in all areas of dental care including fillings, crowns and bridges, complete and partial dentures, implants, gum treatment, root canals, and extractions. Preventive care is also provided. Treatment is provided to both adults and children. Patients are selected via a lottery and there is often a waiting list.   [facility] [address], [place]  [phone] [email or web address]   [facility]  [address] [place], [place] [phone], Ext. [phone] Second and Fourth Thursday of each month, opens at 6:30 AM; Clinic ends at 9 AM.  Patients are seen on a first-come first-served basis, and a limited number are seen during each clinic.   [facility]  [address] Ether [place], [place] [phone]  Eligibility Requirements You must have lived in [place], [place], or [place] counties for at least the last three months.   You cannot be eligible for state or federal sponsored [place], including [employer], [place], or [employer].   You generally cannot be eligible for healthcare insurance through your employer.    How to apply: Eligibility screenings are held every Tuesday and Wednesday afternoon from 1:00 pm until 4:00 pm. You do not need an appointment for the interview!  [facility] [address] [place], [place] [phone]   [facility] Health Department  [phone]   [facility] Health Department  [phone]   [facility] Health Department  [phone]    Behavioral Health Resources in the Community: Intensive Outpatient Programs Organization         Address  Phone  Notes  [facility] [address] [place], [place] [phone]   [facility] Outpatient [address], [place], [place] [phone]   [facility] [address] [place], [place]  [phone]   [facility] Mental Health [address],  [place], [place] [phone] or [phone]   Substance Abuse Resources Organization         Address  Phone  Notes  [facility]  [phone]   [facility]  [phone]   [facility] [place]  [phone]   Floydene Flock  [phone]   Residential & Outpatient Substance Abuse Program  [phone]   Psychological Services Organization         Address  Phone  Notes  [facility]  [phone]    [facility]  [phone]   [facility] Mental Health [address] [place] [phone] or [phone]    Mobile Crisis Teams Organization         Address  Phone  Notes  [facility], Mobile Crisis Care Unit  [phone]   [facility]  [address] [place], [place] [phone]   Doristine Locks [address] [place] [phone]    Self-Help/Support Groups Organization         Address  Phone             Notes  [facility] [place] - variety of support groups  336- I7437963 Call for more information  Narcotics Anonymous (NA), [facility] [address], [employer] [place]  2 meetings at this location   Statistician         Address  Phone  Notes  [facility] [address],    [place]  [phone]   [facility]  [address], [place] [address], [place], [place] [phone]   [facility] [address] [place], [place] [phone] Admissions: 8am-3pm M-F  [facility] [address]    [place], [place] [phone]   [facility] [address] Starling Manns [place], [place] [phone]   [facility] [address],  [place], [place] [phone]   [facility] - Intensive Outpatient [address] Laurell Josephs [address], [place], [place] [phone]   [facility] ([facility] [address] [place].,  [place], [place] [phone] or [phone]   [facility] ([facility]) [address], [place], [place] [phone] Accepts Medicaid  [facility] [place] [address]  [place] [phone] Substance Abuse/Addiction Treatment   [facility] Resources Organization  Address  Phone  Notes  [facility]  [phone]   Domenic Schwab, PhD [address] Arlis Porta [place], [place]   [phone] or [phone]    [place], [place] [phone]   [address] [place], [place] [phone] Insurance/Medicaid/sponsorship through [facility] and Families [address]                                    [place], [place] [phone] [facility] [address], [place] [phone]    Dr. Adele Schilder  [phone]   [facility] Dept. 1) [address] [place] 2) [place] 3)  [address], [place] [phone]  [phone]   [place] [phone] or [phone] (After Hours)

## 2015-02-09 NOTE — ED Provider Notes (Addendum)
TIME SEEN: 9:10 AM  CHIEF COMPLAINT: Epigastric abdominal pain, vomiting  HPI: Pt is a 39 y.o. male with reported history of abdominal hernia who presents to the emergency department with complaints of epigastric sharp abdominal pain that started last night, vomiting stomach acid with small streaks of blood, pain that radiates into his chest that started last night. States he has tried drinking milk and taking Alka-Seltzer without relief. No aggravating factors. Has never had similar symptoms. Describes the pain as severe. No shortness of breath. States he has had normal bowel movement today, no blood or melena. No diarrhea. Denies previous history of abdominal surgery. Denies ever being told he had a ulcer in the past. Does not take NSAIDs regularly but states he does drink 120 ounces of alcohol daily. Has never had an EGD. Patient denies any history of ACS.  ROS: See HPI Constitutional: no fever  Eyes: no drainage  ENT: no runny nose   Cardiovascular:   chest pain  Resp: no SOB  GI:  vomiting GU: no dysuria Integumentary: no rash  Allergy: no hives  Musculoskeletal: no leg swelling  Neurological: no slurred speech ROS otherwise negative  PAST MEDICAL HISTORY/PAST SURGICAL HISTORY:  Past Medical History  Diagnosis Date  . Hernia   . Hernia     MEDICATIONS:  Prior to Admission medications   Medication Sig Start Date End Date Taking? Authorizing Provider  acetaminophen (TYLENOL) 500 MG tablet Take 500 mg by mouth every 6 (six) hours as needed for mild pain.    Historical Provider, MD  cyclobenzaprine (FLEXERIL) 10 MG tablet Take 1 tablet (10 mg total) by mouth 2 (two) times daily as needed for muscle spasms. 04/10/14   Roxy Horseman, PA-C  HYDROcodone-acetaminophen (NORCO/VICODIN) 5-325 MG per tablet Take 1-2 tablets by mouth every 6 (six) hours as needed for moderate pain or severe pain. 04/10/14   Roxy Horseman, PA-C    ALLERGIES:  Allergies  Allergen Reactions  . Amoxicillin  Diarrhea, Itching and Nausea And Vomiting  . Penicillins Itching, Nausea And Vomiting and Rash    SOCIAL HISTORY:  History  Substance Use Topics  . Smoking status: Current Every Day Smoker -- 1.00 packs/day  . Smokeless tobacco: Not on file  . Alcohol Use: Yes     Comment: occasionally    FAMILY HISTORY: No family history on file.  EXAM: BP 137/94 mmHg  Pulse 67  Temp(Src) 97.8 F (36.6 C) (Oral)  Resp 18  Ht  (1.803 m)  Wt 165 lb (74.844 kg)  BMI 23.02 kg/m2  SpO2 99% CONSTITUTIONAL: Alert and oriented and responds appropriately to questions. Well-appearing; well-nourished HEAD: Normocephalic EYES: Conjunctivae clear, PERRL ENT: normal nose; no rhinorrhea; moist mucous membranes; pharynx without lesions noted NECK: Supple, no meningismus, no LAD  CARD: RRR; S1 and S2 appreciated; no murmurs, no clicks, no rubs, no gallops RESP: Normal chest excursion without splinting or tachypnea; breath sounds clear and equal bilaterally; no wheezes, no rhonchi, no rales, no hypoxia or respiratory distress, speaking full sentences ABD/GI: Normal bowel sounds; non-distended; soft, mildly tender to palpation in the epigastric region, negative Murphy sign, no tenderness at McBurney's point, no rebound, no guarding, no peritoneal signs BACK:  The back appears normal and is non-tender to palpation, there is no CVA tenderness EXT: Normal ROM in all joints; non-tender to palpation; no edema; normal capillary refill; no cyanosis, no calf tenderness or swelling    SKIN: Normal color for age and race; warm NEURO: Moves all extremities equally,  sensation to light touch intact diffusely, cranial nerves II through XII intact PSYCH: The patient's mood and manner are appropriate. Grooming and personal hygiene are appropriate.  MEDICAL DECISION MAKING: Patient with epigastric pain that radiates into his chest with vomiting. Suspect gastritis, GERD. Abdominal exam is otherwise benign. We'll obtain  abdominal labs, EKG and acute abdominal series. We'll give Zofran, Protonix, GI cocktail and reassess.  ED PROGRESS: Patient's EKG shows no ischemic changes. Troponin negative. Patient reports some improvement after above medications. Still complaining of some pain. We'll give Vicodin and fluid challenge.  10:30 PM  Pt has tolerated by mouth and had no further vomiting in the ED. Labs unremarkable. We'll discharge with prescription for Prilosec, Carafate, Phenergan. Discussed with patient that I think his symptoms are secondary to GERD, gastritis which may be secondary to his alcohol abuse. I will not discharge the patient with narcotics given he does drink alcohol every day. Have advised him to follow-up with gastroenterology if symptoms continue. Discussed return precautions. He verbalizes understanding and is comfortable with plan. His wife is coming to pick him up from the ED.    EKG Interpretation  Date/Time:  Friday February 09 2015 09:41:13 EDT Ventricular Rate:  54 PR Interval:  132 QRS Duration: 88 QT Interval:  458 QTC Calculation: 434 R Axis:   79 Text Interpretation:  Sinus rhythm No old tracing to compare Confirmed by Joenathan Sakuma,  DO, Nga Rabon [phone]) on 02/09/2015 9:44:23 AM        Layla Maw Graylen [place], DO 02/09/15 1028  Rilla Buckman N Danyal Whitenack, DO 02/09/15 1030

## 2015-02-09 NOTE — ED Notes (Signed)
Pt reports abdominal pain, n/v/d since last night, pain is in the mid abdomen, tender to palpation, also reports "lots of heartburn".

## 2015-02-09 NOTE — ED Notes (Signed)
Water given for fluid challenge  ?

## 2019-06-22 ENCOUNTER — Inpatient Hospital Stay (HOSPITAL_COMMUNITY)
Admission: EM | Admit: 2019-06-22 | Discharge: 2019-07-07 | DRG: 004 | Discharge status: Against Medical Advice | Payer: Self-pay | Attending: General Surgery | Admitting: General Surgery

## 2019-06-22 ENCOUNTER — Emergency Department (HOSPITAL_COMMUNITY): Payer: Self-pay

## 2019-06-22 ENCOUNTER — Encounter (HOSPITAL_COMMUNITY): Payer: Self-pay

## 2019-06-22 ENCOUNTER — Other Ambulatory Visit: Payer: Self-pay

## 2019-06-22 DIAGNOSIS — S0219XA Other fracture of base of skull, initial encounter for closed fracture: Secondary | ICD-10-CM | POA: Diagnosis present

## 2019-06-22 DIAGNOSIS — S27321A Contusion of lung, unilateral, initial encounter: Secondary | ICD-10-CM

## 2019-06-22 DIAGNOSIS — R339 Retention of urine, unspecified: Secondary | ICD-10-CM | POA: Diagnosis present

## 2019-06-22 DIAGNOSIS — J189 Pneumonia, unspecified organism: Secondary | ICD-10-CM

## 2019-06-22 DIAGNOSIS — S270XXA Traumatic pneumothorax, initial encounter: Secondary | ICD-10-CM | POA: Diagnosis present

## 2019-06-22 DIAGNOSIS — M79603 Pain in arm, unspecified: Secondary | ICD-10-CM

## 2019-06-22 DIAGNOSIS — S066X9A Traumatic subarachnoid hemorrhage with loss of consciousness of unspecified duration, initial encounter: Principal | ICD-10-CM | POA: Diagnosis present

## 2019-06-22 DIAGNOSIS — Z7409 Other reduced mobility: Secondary | ICD-10-CM

## 2019-06-22 DIAGNOSIS — J90 Pleural effusion, not elsewhere classified: Secondary | ICD-10-CM | POA: Diagnosis not present

## 2019-06-22 DIAGNOSIS — S42002A Fracture of unspecified part of left clavicle, initial encounter for closed fracture: Secondary | ICD-10-CM | POA: Diagnosis present

## 2019-06-22 DIAGNOSIS — Z978 Presence of other specified devices: Secondary | ICD-10-CM

## 2019-06-22 DIAGNOSIS — Z01818 Encounter for other preprocedural examination: Secondary | ICD-10-CM

## 2019-06-22 DIAGNOSIS — D62 Acute posthemorrhagic anemia: Secondary | ICD-10-CM | POA: Diagnosis not present

## 2019-06-22 DIAGNOSIS — R609 Edema, unspecified: Secondary | ICD-10-CM

## 2019-06-22 DIAGNOSIS — Z781 Physical restraint status: Secondary | ICD-10-CM

## 2019-06-22 DIAGNOSIS — J9601 Acute respiratory failure with hypoxia: Secondary | ICD-10-CM | POA: Diagnosis present

## 2019-06-22 DIAGNOSIS — S0101XA Laceration without foreign body of scalp, initial encounter: Secondary | ICD-10-CM | POA: Diagnosis present

## 2019-06-22 DIAGNOSIS — Z4682 Encounter for fitting and adjustment of non-vascular catheter: Secondary | ICD-10-CM

## 2019-06-22 DIAGNOSIS — Z9689 Presence of other specified functional implants: Secondary | ICD-10-CM

## 2019-06-22 DIAGNOSIS — J9690 Respiratory failure, unspecified, unspecified whether with hypoxia or hypercapnia: Secondary | ICD-10-CM

## 2019-06-22 DIAGNOSIS — Y92410 Unspecified street and highway as the place of occurrence of the external cause: Secondary | ICD-10-CM

## 2019-06-22 DIAGNOSIS — S2242XA Multiple fractures of ribs, left side, initial encounter for closed fracture: Secondary | ICD-10-CM | POA: Diagnosis present

## 2019-06-22 DIAGNOSIS — S27329A Contusion of lung, unspecified, initial encounter: Secondary | ICD-10-CM | POA: Diagnosis present

## 2019-06-22 DIAGNOSIS — S42018A Nondisplaced fracture of sternal end of left clavicle, initial encounter for closed fracture: Secondary | ICD-10-CM

## 2019-06-22 DIAGNOSIS — Z20828 Contact with and (suspected) exposure to other viral communicable diseases: Secondary | ICD-10-CM | POA: Diagnosis present

## 2019-06-22 DIAGNOSIS — S069X9A Unspecified intracranial injury with loss of consciousness of unspecified duration, initial encounter: Secondary | ICD-10-CM

## 2019-06-22 DIAGNOSIS — J939 Pneumothorax, unspecified: Secondary | ICD-10-CM

## 2019-06-22 LAB — CDS SEROLOGY

## 2019-06-22 LAB — COMPREHENSIVE METABOLIC PANEL
ALT: 21 U/L (ref 0–44)
AST: 28 U/L (ref 15–41)
Albumin: 4 g/dL (ref 3.5–5.0)
Alkaline Phosphatase: 91 U/L (ref 38–126)
Anion gap: 12 (ref 5–15)
BUN: 21 mg/dL — ABNORMAL HIGH (ref 6–20)
CO2: 21 mmol/L — ABNORMAL LOW (ref 22–32)
Calcium: 9 mg/dL (ref 8.9–10.3)
Chloride: 107 mmol/L (ref 98–111)
Creatinine, Ser: 1.22 mg/dL (ref 0.61–1.24)
GFR calc Af Amer: >60 mL/min (ref >60)
GFR calc non Af Amer: >60 mL/min (ref >60)
Glucose, Bld: 141 mg/dL — ABNORMAL HIGH (ref 70–99)
Potassium: 4 mmol/L (ref 3.5–5.1)
Sodium: 140 mmol/L (ref 135–145)
Total Bilirubin: 0.6 mg/dL (ref 0.3–1.2)
Total Protein: 6.8 g/dL (ref 6.5–8.1)

## 2019-06-22 LAB — POCT I-STAT 7, (LYTES, BLD GAS, ICA,H+H)
Acid-Base Excess: 2 mmol/L (ref 0.0–2.0)
Bicarbonate: 27 mmol/L (ref 20.0–28.0)
Calcium, Ion: 1.24 mmol/L (ref 1.15–1.40)
HCT: 35 % — ABNORMAL LOW (ref 39.0–52.0)
Hemoglobin: 11.9 g/dL — ABNORMAL LOW (ref 13.0–17.0)
O2 Saturation: 100 %
Potassium: 4.1 mmol/L (ref 3.5–5.1)
Sodium: 139 mmol/L (ref 135–145)
TCO2: 28 mmol/L (ref 22–32)
pCO2 arterial: 45.2 mmHg (ref 32.0–48.0)
pH, Arterial: 7.384 (ref 7.350–7.450)
pO2, Arterial: 470 mmHg — ABNORMAL HIGH (ref 83.0–108.0)

## 2019-06-22 LAB — I-STAT CHEM 8, ED
BUN: 21 mg/dL — ABNORMAL HIGH (ref 6–20)
Calcium, Ion: 1.1 mmol/L — ABNORMAL LOW (ref 1.15–1.40)
Chloride: 106 mmol/L (ref 98–111)
Creatinine, Ser: 1.1 mg/dL (ref 0.61–1.24)
Glucose, Bld: 133 mg/dL — ABNORMAL HIGH (ref 70–99)
HCT: 37 % — ABNORMAL LOW (ref 39.0–52.0)
Hemoglobin: 12.6 g/dL — ABNORMAL LOW (ref 13.0–17.0)
Potassium: 4 mmol/L (ref 3.5–5.1)
Sodium: 142 mmol/L (ref 135–145)
TCO2: 21 mmol/L — ABNORMAL LOW (ref 22–32)

## 2019-06-22 LAB — SARS CORONAVIRUS 2 (TAT 6-24 HRS): SARS Coronavirus 2: NEGATIVE

## 2019-06-22 LAB — CBC
HCT: 37.5 % — ABNORMAL LOW (ref 39.0–52.0)
Hemoglobin: 12.3 g/dL — ABNORMAL LOW (ref 13.0–17.0)
MCH: 31.9 pg (ref 26.0–34.0)
MCHC: 32.8 g/dL (ref 30.0–36.0)
MCV: 97.4 fL (ref 80.0–100.0)
Platelets: 329 K/uL (ref 150–400)
RBC: 3.85 MIL/uL — ABNORMAL LOW (ref 4.22–5.81)
RDW: 13.4 % (ref 11.5–15.5)
WBC: 11.6 K/uL — ABNORMAL HIGH (ref 4.0–10.5)
nRBC: 0 % (ref 0.0–0.2)

## 2019-06-22 LAB — PROTIME-INR
INR: 1 (ref 0.8–1.2)
Prothrombin Time: 13.2 seconds (ref 11.4–15.2)

## 2019-06-22 LAB — SAMPLE TO BLOOD BANK

## 2019-06-22 LAB — ETHANOL: Alcohol, Ethyl (B): <10 mg/dL (ref <10)

## 2019-06-22 LAB — LACTIC ACID, PLASMA: Lactic Acid, Venous: 2.8 mmol/L (ref 0.5–1.9)

## 2019-06-22 MED ORDER — BISACODYL 10 MG RE SUPP: 10.0000 mg | Freq: Every day | RECTAL | Status: DC | PRN

## 2019-06-22 MED ORDER — FENTANYL CITRATE (PF) 100 MCG/2ML IJ SOLN
INTRAMUSCULAR | Status: AC
  Filled 2019-06-22: qty 2

## 2019-06-22 MED ORDER — FENTANYL CITRATE (PF) 100 MCG/2ML IJ SOLN
INTRAMUSCULAR | Status: AC | PRN
  Administered 2019-06-22: 100 ug via INTRAVENOUS

## 2019-06-22 MED ORDER — LORAZEPAM 2 MG/ML IJ SOLN
INTRAMUSCULAR | Status: AC | PRN
  Administered 2019-06-22 (×2): 1 mg via INTRAVENOUS

## 2019-06-22 MED ORDER — LACTATED RINGERS IV SOLN
INTRAVENOUS | Status: DC
  Administered 2019-06-22 – 2019-06-25 (×6): via INTRAVENOUS

## 2019-06-22 MED ORDER — METOPROLOL TARTRATE 5 MG/5ML IV SOLN
5.0000 mg | Freq: Four times a day (QID) | INTRAVENOUS | Status: DC | PRN
  Filled 2019-06-22: qty 5

## 2019-06-22 MED ORDER — ONDANSETRON 4 MG PO TBDP
4.0000 mg | ORAL_TABLET | Freq: Four times a day (QID) | ORAL | Status: DC | PRN
  Administered 2019-07-07: 10:00:00 4 mg via ORAL
  Filled 2019-06-22: qty 1

## 2019-06-22 MED ORDER — LORAZEPAM 2 MG/ML IJ SOLN
INTRAMUSCULAR | Status: AC
  Filled 2019-06-22: qty 1

## 2019-06-22 MED ORDER — ROCURONIUM BROMIDE 50 MG/5ML IV SOLN
INTRAVENOUS | Status: AC | PRN
  Administered 2019-06-22: 100 mg via INTRAVENOUS

## 2019-06-22 MED ORDER — FENTANYL CITRATE (PF) 100 MCG/2ML IJ SOLN
INTRAMUSCULAR | Status: AC
  Administered 2019-06-22: 100 ug
  Filled 2019-06-22: qty 2

## 2019-06-22 MED ORDER — PROPOFOL 1000 MG/100ML IV EMUL
INTRAVENOUS | Status: AC | PRN
  Administered 2019-06-22: 10 ug via INTRAVENOUS

## 2019-06-22 MED ORDER — PROPOFOL 1000 MG/100ML IV EMUL
0.0000 ug/kg/min | INTRAVENOUS | Status: DC
  Administered 2019-06-23 (×2): 40 ug/kg/min via INTRAVENOUS
  Administered 2019-06-23 (×3): 50 ug/kg/min via INTRAVENOUS
  Administered 2019-06-23 – 2019-06-24 (×4): 40 ug/kg/min via INTRAVENOUS
  Administered 2019-06-24: 50 ug/kg/min via INTRAVENOUS
  Administered 2019-06-24 – 2019-06-25 (×2): 40 ug/kg/min via INTRAVENOUS
  Administered 2019-06-25: 19:00:00 30 ug/kg/min via INTRAVENOUS
  Administered 2019-06-25: 07:00:00 45 ug/kg/min via INTRAVENOUS
  Administered 2019-06-25: 11:00:00 50 ug/kg/min via INTRAVENOUS
  Administered 2019-06-26: 25 ug/kg/min via INTRAVENOUS
  Administered 2019-06-26 (×2): 30 ug/kg/min via INTRAVENOUS
  Filled 2019-06-22 (×3): qty 100
  Filled 2019-06-22: qty 200
  Filled 2019-06-22 (×8): qty 100
  Filled 2019-06-22: qty 200
  Filled 2019-06-22: qty 100

## 2019-06-22 MED ORDER — FENTANYL CITRATE (PF) 100 MCG/2ML IJ SOLN
50.0000 ug | INTRAMUSCULAR | Status: DC | PRN
  Filled 2019-06-22: qty 2

## 2019-06-22 MED ORDER — FENTANYL CITRATE (PF) 100 MCG/2ML IJ SOLN
50.0000 ug | INTRAMUSCULAR | Status: DC | PRN
  Administered 2019-06-23 (×8): 100 ug via INTRAVENOUS
  Administered 2019-06-24: 200 ug via INTRAVENOUS
  Administered 2019-06-25 – 2019-06-26 (×13): 100 ug via INTRAVENOUS
  Administered 2019-06-26: 200 ug via INTRAVENOUS
  Administered 2019-06-26 (×2): 100 ug via INTRAVENOUS
  Filled 2019-06-22: qty 4
  Filled 2019-06-22: qty 2
  Filled 2019-06-22: qty 4
  Filled 2019-06-22 (×7): qty 2
  Filled 2019-06-22: qty 4
  Filled 2019-06-22: qty 2
  Filled 2019-06-22: qty 4
  Filled 2019-06-22 (×3): qty 2
  Filled 2019-06-22: qty 4
  Filled 2019-06-22 (×2): qty 2
  Filled 2019-06-22 (×2): qty 4

## 2019-06-22 MED ORDER — ORAL CARE MOUTH RINSE
15.0000 mL | OROMUCOSAL | Status: DC
  Administered 2019-06-23 – 2019-06-30 (×75): 15 mL via OROMUCOSAL

## 2019-06-22 MED ORDER — CHLORHEXIDINE GLUCONATE 0.12% ORAL RINSE (MEDLINE KIT)
15.0000 mL | Freq: Two times a day (BID) | OROMUCOSAL | Status: DC
  Administered 2019-06-22 – 2019-07-06 (×26): 15 mL via OROMUCOSAL

## 2019-06-22 MED ORDER — DOCUSATE SODIUM 50 MG/5ML PO LIQD
100.0000 mg | Freq: Two times a day (BID) | ORAL | Status: DC | PRN
  Administered 2019-06-26 – 2019-06-27 (×2): 100 mg
  Filled 2019-06-22 (×2): qty 10

## 2019-06-22 MED ORDER — CHLORHEXIDINE GLUCONATE CLOTH 2 % EX PADS
6.0000 | MEDICATED_PAD | Freq: Every day | CUTANEOUS | Status: DC
  Administered 2019-06-24 – 2019-07-07 (×14): 6 via TOPICAL

## 2019-06-22 MED ORDER — TETANUS-DIPHTH-ACELL PERTUSSIS 5-2.5-18.5 LF-MCG/0.5 IM SUSP
0.5000 mL | Freq: Once | INTRAMUSCULAR | Status: AC
  Administered 2019-06-22: 0.5 mL via INTRAMUSCULAR

## 2019-06-22 MED ORDER — ONDANSETRON HCL 4 MG/2ML IJ SOLN
4.0000 mg | Freq: Four times a day (QID) | INTRAMUSCULAR | Status: DC | PRN
  Filled 2019-06-22: qty 2

## 2019-06-22 MED ORDER — IOHEXOL 350 MG/ML SOLN
100.0000 mL | Freq: Once | INTRAVENOUS | Status: AC | PRN
  Administered 2019-06-22: 100 mL via INTRAVENOUS

## 2019-06-22 MED ORDER — ETOMIDATE 2 MG/ML IV SOLN
INTRAVENOUS | Status: AC | PRN
  Administered 2019-06-22: 20 mg via INTRAVENOUS

## 2019-06-22 NOTE — ED Notes (Signed)
Family at bedside   Deloris (mother) [phone]  Merry Proud (brother) [phone]
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
## 2019-06-22 NOTE — ED Notes (Signed)
Pt in CT.
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
## 2019-06-22 NOTE — ED Notes (Signed)
Pt returned from CT °

## 2019-06-23 ENCOUNTER — Inpatient Hospital Stay (HOSPITAL_COMMUNITY): Payer: Self-pay

## 2019-06-23 LAB — BASIC METABOLIC PANEL
Anion gap: 9 (ref 5–15)
BUN: 19 mg/dL (ref 6–20)
CO2: 23 mmol/L (ref 22–32)
Calcium: 8.9 mg/dL (ref 8.9–10.3)
Chloride: 106 mmol/L (ref 98–111)
Creatinine, Ser: 1.02 mg/dL (ref 0.61–1.24)
GFR calc Af Amer: >60 mL/min (ref >60)
GFR calc non Af Amer: >60 mL/min (ref >60)
Glucose, Bld: 126 mg/dL — ABNORMAL HIGH (ref 70–99)
Potassium: 4.1 mmol/L (ref 3.5–5.1)
Sodium: 138 mmol/L (ref 135–145)

## 2019-06-23 LAB — CBC
HCT: 34 % — ABNORMAL LOW (ref 39.0–52.0)
Hemoglobin: 11.3 g/dL — ABNORMAL LOW (ref 13.0–17.0)
MCH: 31.6 pg (ref 26.0–34.0)
MCHC: 33.2 g/dL (ref 30.0–36.0)
MCV: 95 fL (ref 80.0–100.0)
Platelets: 278 10*3/uL (ref 150–400)
RBC: 3.58 MIL/uL — ABNORMAL LOW (ref 4.22–5.81)
RDW: 13.5 % (ref 11.5–15.5)
WBC: 11.9 10*3/uL — ABNORMAL HIGH (ref 4.0–10.5)
nRBC: 0 % (ref 0.0–0.2)

## 2019-06-23 LAB — MRSA PCR SCREENING: MRSA by PCR: NEGATIVE

## 2019-06-23 LAB — TRIGLYCERIDES: Triglycerides: 59 mg/dL (ref <150)

## 2019-06-23 LAB — HIV ANTIBODY (ROUTINE TESTING W REFLEX): HIV Screen 4th Generation wRfx: NONREACTIVE

## 2019-06-23 MED ORDER — BACITRACIN ZINC 500 UNIT/GM EX OINT
TOPICAL_OINTMENT | Freq: Two times a day (BID) | CUTANEOUS | Status: DC
  Administered 2019-06-23: 1 via TOPICAL
  Administered 2019-06-23 – 2019-06-24 (×3): via TOPICAL
  Administered 2019-06-25: 1 via TOPICAL
  Administered 2019-06-25: 21:00:00 via TOPICAL
  Administered 2019-06-26: 1 via TOPICAL
  Administered 2019-06-26 – 2019-06-27 (×2): via TOPICAL
  Administered 2019-06-27: 1 via TOPICAL
  Administered 2019-06-28 – 2019-06-30 (×5): via TOPICAL
  Administered 2019-06-30 – 2019-07-01 (×2): 1 via TOPICAL
  Administered 2019-07-01 – 2019-07-02 (×2): via TOPICAL
  Administered 2019-07-02: 1 via TOPICAL
  Administered 2019-07-03 (×2): via TOPICAL
  Administered 2019-07-04: 1 via TOPICAL
  Administered 2019-07-04 – 2019-07-05 (×2): via TOPICAL
  Administered 2019-07-05: 1 via TOPICAL
  Administered 2019-07-06 – 2019-07-07 (×2): via TOPICAL
  Filled 2019-06-23: qty 28.4

## 2019-06-23 MED ORDER — ALBUMIN HUMAN 5 % IV SOLN
12.5000 g | Freq: Once | INTRAVENOUS | Status: AC
  Administered 2019-06-23: 12.5 g via INTRAVENOUS
  Filled 2019-06-23: qty 500

## 2019-06-23 MED ORDER — PANTOPRAZOLE SODIUM 40 MG IV SOLR
40.0000 mg | INTRAVENOUS | Status: DC
  Administered 2019-06-23 – 2019-06-24 (×2): 40 mg via INTRAVENOUS
  Filled 2019-06-23 (×2): qty 40

## 2019-06-23 NOTE — Progress Notes (Signed)
Subjective:     Patient intubated and sedated   Objective:   VITALS:  Temp:  [96.5 F (35.8 C)-98.8 F (37.1 C)] 98.8 F (37.1 C) (11/05 0800) Pulse Rate:  [81-126] 86 (11/05 1100) Resp:  [11-26] 18 (11/05 1100) BP: (103-153)/(76-119) 122/93 (11/05 1100) SpO2:  [91 %-100 %] 100 % (11/05 1100) FiO2 (%):  [40 %-100 %] 40 % (11/05 0835) Weight:  [70.9 kg-81.6 kg] 70.9 kg (11/04 2350)   Sling intact on L UE Radial pulse 2+  LABS Recent Labs    06/22/19 1914 06/22/19 1925 06/22/19 2038 06/23/19 0423  HGB 12.3* 12.6* 11.9* 11.3*  WBC 11.6*  --   --  11.9*  PLT 329  --   --  278   Recent Labs    06/22/19 1914 06/22/19 1925 06/22/19 2038 06/23/19 0423  NA 140 142 139 138  K 4.0 4.0 4.1 4.1  CL 107 106  --  106  CO2 21*  --   --  23  BUN 21* 21*  --  19  CREATININE 1.22 1.10  --  1.02  GLUCOSE 141* 133*  --  126*   Recent Labs    06/22/19 1914  INR 1.0     Assessment/Plan:     Left clavicle fx  -L UE in sling at all times.  -Plan for follow up radiographs of left clavicle when patient is able to sit upright.    -Patient may ultimately require operative intervention.  Mechele Claude PA-C [facility] Office:  [phone]
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
## 2019-06-23 NOTE — Progress Notes (Signed)
Radiologist reported 20 percent increase in pt's Lt pneumothorax. Reported to Dr. Grandville Silos.
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
## 2019-06-23 NOTE — Procedures (Signed)
Chest Tube Insertion Procedure Note  Indications:  Clinically significant L pneumothorax  Pre-operative Diagnosis: L pneumothorax  Post-operative Diagnosis: L pneumothorax  Procedure Details  Informed consent was obtained for the procedure, including sedation.  Risks of lung perforation, hemorrhage, arrhythmia, and adverse drug reaction were discussed.   After sterile skin prep, using standard technique, a 14 French tube was placed in the left anterior axillary line well above the nipple using Seldinger technique. It was attached to a Pleurevac and a sterile dressing was applied.  Findings: air  Estimated Blood Loss:  Minimal         Specimens:  None              Complications:  None; patient tolerated the procedure well.         Disposition: ICU - intubated and critically ill.         Condition: stable  Georganna Skeans, MD, MPH, FACS Please use [email or web address] to contact on call provider

## 2019-06-23 NOTE — Progress Notes (Addendum)
Patient ID: Brandon Flowers, male   DOB: 05/29/1976, 43 y.o.   MRN: [id number] Follow up - Trauma Critical Care  Patient Details:    Brandon Newcomerhomas L Shoemaker is an 43 y.o. male.  Lines/tubes : Airway 7.5 mm (Active)  Secured at (cm) 25 cm 06/23/19 0835  Measured From Lips 06/23/19 0835  Secured Location Right 06/23/19 0835  Secured By [employer] Tube Holder 06/23/19 0835  Tube Holder Repositioned Yes 06/23/19 0835  Cuff Pressure (cm H2O) 30 cm H2O 06/22/19 1927  Site Condition Dry 06/23/19 0835     NG/OG Tube Orogastric 18 Fr. Center mouth Aucultation;Xray (Active)  Site Assessment Clean;Dry;Intact 06/23/19 0748  Ongoing Placement Verification No change in respiratory status;No change in cm markings or external length of tube from initial placement 06/23/19 0700  Status Irrigated;Suction-low intermittent 06/23/19 0700     Urethral Catheter Kathlene CoteJamie Armstrong, RN Straight-tip 16 Fr. (Active)  Indication for Insertion or Continuance of Catheter Unstable critically ill patients first 24-48 hours (See Criteria);Acute urinary retention (I&O Cath for 24 hrs prior to catheter insertion- Inpatient Only) 06/23/19 0748  Site Assessment Clean;Intact 06/23/19 0748  Catheter Maintenance Bag below level of bladder;Catheter secured;Drainage bag/tubing not touching floor;Insertion date on drainage bag;No dependent loops;Seal intact 06/23/19 0748  Output (mL) 30 mL 06/23/19 0700     External Urinary Catheter (Active)  Collection Container Standard drainage bag 06/23/19 0000  Securement Method Tape 06/23/19 0000  Site Assessment Clean;Intact 06/23/19 0000    Microbiology/Sepsis markers: Results for orders placed or performed during the hospital encounter of 06/22/19  SARS CORONAVIRUS 2 (TAT 6-24 HRS) Nasopharyngeal Nasopharyngeal Swab     Status: None   Collection Time: 06/22/19  7:44 PM   Specimen: Nasopharyngeal Swab  Result Value Ref Range Status   SARS Coronavirus 2 NEGATIVE NEGATIVE Final   Comment: (NOTE) SARS-CoV-2 target nucleic acids are NOT DETECTED. The SARS-CoV-2 RNA is generally detectable in upper and lower respiratory specimens during the acute phase of infection. Negative results do not preclude SARS-CoV-2 infection, do not rule out co-infections with other pathogens, and should not be used as the sole basis for treatment or other patient management decisions. Negative results must be combined with clinical observations, patient history, and epidemiological information. The expected result is Negative. Fact Sheet for Patients: HairSlick.nohttps://www.fda.gov/media/138098/download Fact Sheet for Healthcare Providers: quierodirigir.comhttps://www.fda.gov/media/138095/download This test is not yet approved or cleared by the [place] FDA and  has been authorized for detection and/or diagnosis of SARS-CoV-2 by FDA under an Emergency Use Authorization (EUA). This EUA will remain  in effect (meaning this test can be used) for the duration of the COVID-19 declaration under Section 56 4(b)(1) of the Act, 21 U.S.C. section 360bbb-3(b)(1), unless the authorization is terminated or revoked sooner. Performed at [facility], [address] [place], [place] [address]   MRSA PCR Screening     Status: None   Collection Time: 06/22/19 11:59 PM   Specimen: Nasal Mucosa; Nasopharyngeal  Result Value Ref Range Status   MRSA by PCR NEGATIVE NEGATIVE Final    Comment:        The GeneXpert MRSA Assay (FDA approved for NASAL specimens only), is one component of a comprehensive MRSA colonization surveillance program. It is not intended to diagnose MRSA infection nor to guide or monitor treatment for MRSA infections. Performed at [facility], [address] [place], [place] [address]     Anti-infectives:  Anti-infectives (From admission, onward)   None  Best Practice/Protocols:  VTE Prophylaxis: Mechanical Continous Sedation  Consults: Treatment Team:  Md, Trauma, MD  Judith Part, MD Wylene Simmer, MD    Studies:    Events:  Subjective:    Overnight Issues:   Objective:  Vital signs for last 24 hours: Temp:  [96.5 F (35.8 C)-98.8 F (37.1 C)] 98.8 F (37.1 C) (11/05 0800) Pulse Rate:  [81-126] 96 (11/05 0835) Resp:  [11-26] 18 (11/05 0835) BP: (107-153)/(86-119) 112/86 (11/05 0835) SpO2:  [91 %-100 %] 96 % (11/05 0835) FiO2 (%):  [40 %-100 %] 40 % (11/05 0835) Weight:  [70.9 kg-81.6 kg] 70.9 kg (11/04 2350)  Hemodynamic parameters for last 24 hours:    Intake/Output from previous day: 11/04 0701 - 11/05 0700 In: 1219.5 [I.V.:1219.5] Out: 480 [Urine:480]  Intake/Output this shift: No intake/output data recorded.  Vent settings for last 24 hours: Vent Mode: PRVC FiO2 (%):  [40 %-100 %] 40 % Set Rate:  [18 bmp] 18 bmp Vt Set:  [600 mL] 600 mL PEEP:  [5 cmH20] 5 cmH20 Plateau Pressure:  [13 cmH20-19 cmH20] 17 cmH20  Physical Exam:  General: on vent Neuro: did wake up and F/C for RN by report, now sedated and PERL HEENT/Neck: ETT and collar Resp: clear to auscultation bilaterally CVS: RRR GI: soft, nontender, BS WNL, no r/g Extremities: deform L clavicle  Addition: decrease BS on L  Results for orders placed or performed during the hospital encounter of 06/22/19 (from the past 24 hour(s))  CDS serology     Status: None   Collection Time: 06/22/19  7:14 PM  Result Value Ref Range   CDS serology specimen      SPECIMEN WILL BE HELD FOR 14 DAYS IF TESTING IS REQUIRED  Comprehensive metabolic panel     Status: Abnormal   Collection Time: 06/22/19  7:14 PM  Result Value Ref Range   Sodium 140 135 - 145 mmol/L   Potassium 4.0 3.5 - 5.1 mmol/L   Chloride 107 98 - 111 mmol/L   CO2 21 (L) 22 - 32 mmol/L   Glucose, Bld 141 (H) 70 - 99 mg/dL   BUN 21 (H) 6 - 20 mg/dL   Creatinine, Ser 1.22 0.61 - 1.24 mg/dL   Calcium 9.0 8.9 - 10.3 mg/dL   Total Protein 6.8 6.5 - 8.1 g/dL   Albumin 4.0 3.5 - 5.0 g/dL   AST 28 15 -  41 U/L   ALT 21 0 - 44 U/L   Alkaline Phosphatase 91 38 - 126 U/L   Total Bilirubin 0.6 0.3 - 1.2 mg/dL   GFR calc non Af Amer >60 >60 mL/min   GFR calc Af Amer >60 >60 mL/min   Anion gap 12 5 - 15  CBC     Status: Abnormal   Collection Time: 06/22/19  7:14 PM  Result Value Ref Range   WBC 11.6 (H) 4.0 - 10.5 K/uL   RBC 3.85 (L) 4.22 - 5.81 MIL/uL   Hemoglobin 12.3 (L) 13.0 - 17.0 g/dL   HCT 37.5 (L) 39.0 - 52.0 %   MCV 97.4 80.0 - 100.0 fL   MCH 31.9 26.0 - 34.0 pg   MCHC 32.8 30.0 - 36.0 g/dL   RDW 13.4 11.5 - 15.5 %   Platelets 329 150 - 400 K/uL   nRBC 0.0 0.0 - 0.2 %  Ethanol     Status: None   Collection Time: 06/22/19  7:14 PM  Result Value Ref Range   Alcohol, Ethyl (  B) <10 <10 mg/dL  Lactic acid, plasma     Status: Abnormal   Collection Time: 06/22/19  7:14 PM  Result Value Ref Range   Lactic Acid, Venous 2.8 (HH) 0.5 - 1.9 mmol/L  Protime-INR     Status: None   Collection Time: 06/22/19  7:14 PM  Result Value Ref Range   Prothrombin Time 13.2 11.4 - 15.2 seconds   INR 1.0 0.8 - 1.2  Sample to Blood Bank     Status: None   Collection Time: 06/22/19  7:19 PM  Result Value Ref Range   Blood Bank Specimen SAMPLE AVAILABLE FOR TESTING    Sample Expiration      06/23/2019,2359 Performed at [facility], [address], [place], [place] [address]   I-stat chem 8, ED     Status: Abnormal   Collection Time: 06/22/19  7:25 PM  Result Value Ref Range   Sodium 142 135 - 145 mmol/L   Potassium 4.0 3.5 - 5.1 mmol/L   Chloride 106 98 - 111 mmol/L   BUN 21 (H) 6 - 20 mg/dL   Creatinine, Ser 6.04 0.61 - 1.24 mg/dL   Glucose, Bld 540 (H) 70 - 99 mg/dL   Calcium, Ion 9.81 (L) 1.15 - 1.40 mmol/L   TCO2 21 (L) 22 - 32 mmol/L   Hemoglobin 12.6 (L) 13.0 - 17.0 g/dL   HCT 19.1 (L) 47.8 - 29.5 %  SARS CORONAVIRUS 2 (TAT 6-24 HRS) Nasopharyngeal Nasopharyngeal Swab     Status: None   Collection Time: 06/22/19  7:44 PM   Specimen: Nasopharyngeal Swab  Result Value Ref  Range   SARS Coronavirus 2 NEGATIVE NEGATIVE  I-STAT 7, (LYTES, BLD GAS, ICA, H+H)     Status: Abnormal   Collection Time: 06/22/19  8:38 PM  Result Value Ref Range   pH, Arterial 7.384 7.350 - 7.450   pCO2 arterial 45.2 32.0 - 48.0 mmHg   pO2, Arterial 470.0 (H) 83.0 - 108.0 mmHg   Bicarbonate 27.0 20.0 - 28.0 mmol/L   TCO2 28 22 - 32 mmol/L   O2 Saturation 100.0 %   Acid-Base Excess 2.0 0.0 - 2.0 mmol/L   Sodium 139 135 - 145 mmol/L   Potassium 4.1 3.5 - 5.1 mmol/L   Calcium, Ion 1.24 1.15 - 1.40 mmol/L   HCT 35.0 (L) 39.0 - 52.0 %   Hemoglobin 11.9 (L) 13.0 - 17.0 g/dL   Patient temperature HIDE    Collection site RADIAL, ALLEN'S TEST ACCEPTABLE    Sample type ARTERIAL   MRSA PCR Screening     Status: None   Collection Time: 06/22/19 11:59 PM   Specimen: Nasal Mucosa; Nasopharyngeal  Result Value Ref Range   MRSA by PCR NEGATIVE NEGATIVE  CBC     Status: Abnormal   Collection Time: 06/23/19  4:23 AM  Result Value Ref Range   WBC 11.9 (H) 4.0 - 10.5 K/uL   RBC 3.58 (L) 4.22 - 5.81 MIL/uL   Hemoglobin 11.3 (L) 13.0 - 17.0 g/dL   HCT 62.1 (L) 30.8 - 65.7 %   MCV 95.0 80.0 - 100.0 fL   MCH 31.6 26.0 - 34.0 pg   MCHC 33.2 30.0 - 36.0 g/dL   RDW 84.6 96.2 - 95.2 %   Platelets 278 150 - 400 K/uL   nRBC 0.0 0.0 - 0.2 %  Basic metabolic panel     Status: Abnormal   Collection Time: 06/23/19  4:23 AM  Result Value Ref Range  Sodium 138 135 - 145 mmol/L   Potassium 4.1 3.5 - 5.1 mmol/L   Chloride 106 98 - 111 mmol/L   CO2 23 22 - 32 mmol/L   Glucose, Bld 126 (H) 70 - 99 mg/dL   BUN 19 6 - 20 mg/dL   Creatinine, Ser 6.25 0.61 - 1.24 mg/dL   Calcium 8.9 8.9 - 63.8 mg/dL   GFR calc non Af Amer >60 >60 mL/min   GFR calc Af Amer >60 >60 mL/min   Anion gap 9 5 - 15  Triglycerides     Status: None   Collection Time: 06/23/19  4:23 AM  Result Value Ref Range   Triglycerides 59 <150 mg/dL    Assessment & Plan: Present on Admission: **None**    LOS: 1 day   Additional  comments:I reviewed the patient's new clinical lab test results. and CXR Small R SAH - F/C, per Dr. Maurice Small. Will check F/U CT head in AM. L scalp lac - repaired in ED L temporal bone fx - Dr. Suszanne Conners to see L Rib fxs 3, 4, 5 and associated pulm contusion - multimodal pain control L PTX associated with above - larger on CXR - place chest tube now L clavicle - Dr. Victorino Dike consulted, non-op for now Road rash - bacitracin L lateral abd wall abrasion/contusion - wet to dry gauze VTE: SCDs; holding chemical dvt ppx given acute SAH Dispo: 4N ICU I spoke with his mother and brother at the bedside Critical Care Total Time*: 41 Minutes  Violeta Gelinas, MD, MPH, FACS Trauma & General Surgery Use [email or web address] to contact on call provider  06/23/2019  *Care during the described time interval was provided by me. I have reviewed this patient's available data, including medical history, events of note, physical examination and test results as part of my evaluation.

## 2019-06-24 ENCOUNTER — Inpatient Hospital Stay (HOSPITAL_COMMUNITY): Payer: Self-pay

## 2019-06-24 LAB — BASIC METABOLIC PANEL
Anion gap: 12 (ref 5–15)
BUN: 13 mg/dL (ref 6–20)
CO2: 23 mmol/L (ref 22–32)
Calcium: 8.8 mg/dL — ABNORMAL LOW (ref 8.9–10.3)
Chloride: 102 mmol/L (ref 98–111)
Creatinine, Ser: 0.89 mg/dL (ref 0.61–1.24)
GFR calc Af Amer: >60 mL/min (ref >60)
GFR calc non Af Amer: >60 mL/min (ref >60)
Glucose, Bld: 101 mg/dL — ABNORMAL HIGH (ref 70–99)
Potassium: 4 mmol/L (ref 3.5–5.1)
Sodium: 137 mmol/L (ref 135–145)

## 2019-06-24 LAB — CBC
HCT: 32.4 % — ABNORMAL LOW (ref 39.0–52.0)
Hemoglobin: 10.5 g/dL — ABNORMAL LOW (ref 13.0–17.0)
MCH: 31.3 pg (ref 26.0–34.0)
MCHC: 32.4 g/dL (ref 30.0–36.0)
MCV: 96.7 fL (ref 80.0–100.0)
Platelets: 245 10*3/uL (ref 150–400)
RBC: 3.35 MIL/uL — ABNORMAL LOW (ref 4.22–5.81)
RDW: 13.6 % (ref 11.5–15.5)
WBC: 15.4 10*3/uL — ABNORMAL HIGH (ref 4.0–10.5)
nRBC: 0 % (ref 0.0–0.2)

## 2019-06-24 LAB — MAGNESIUM
Magnesium: 1.8 mg/dL (ref 1.7–2.4)
Magnesium: 1.8 mg/dL (ref 1.7–2.4)

## 2019-06-24 LAB — PHOSPHORUS
Phosphorus: 2.9 mg/dL (ref 2.5–4.6)
Phosphorus: 1.7 mg/dL — ABNORMAL LOW (ref 2.5–4.6)

## 2019-06-24 LAB — GLUCOSE, CAPILLARY
Glucose-Capillary: 78 mg/dL (ref 70–99)
Glucose-Capillary: 63 mg/dL — ABNORMAL LOW (ref 70–99)
Glucose-Capillary: 118 mg/dL — ABNORMAL HIGH (ref 70–99)
Glucose-Capillary: 108 mg/dL — ABNORMAL HIGH (ref 70–99)

## 2019-06-24 LAB — TRIGLYCERIDES: Triglycerides: 74 mg/dL (ref <150)

## 2019-06-24 MED ORDER — DEXTROSE 50 % IV SOLN
25.0000 mL | Freq: Once | INTRAVENOUS | Status: AC
  Administered 2019-06-24: 16:00:00 25 mL via INTRAVENOUS

## 2019-06-24 MED ORDER — PIVOT 1.5 CAL PO LIQD
1000.0000 mL | ORAL | Status: DC
  Administered 2019-06-24 – 2019-06-27 (×3): 1000 mL
  Filled 2019-06-24 (×4): qty 1000

## 2019-06-24 MED ORDER — QUETIAPINE FUMARATE 25 MG PO TABS
50.0000 mg | ORAL_TABLET | Freq: Two times a day (BID) | ORAL | Status: DC
  Administered 2019-06-24 (×2): 50 mg
  Filled 2019-06-24 (×3): qty 2

## 2019-06-24 MED ORDER — VITAMIN C 500 MG PO TABS
1000.0000 mg | ORAL_TABLET | Freq: Three times a day (TID) | ORAL | Status: AC
  Administered 2019-06-24 – 2019-06-30 (×20): 1000 mg
  Filled 2019-06-24 (×20): qty 2

## 2019-06-24 MED ORDER — ACETAMINOPHEN 160 MG/5ML PO SOLN
650.0000 mg | Freq: Four times a day (QID) | ORAL | Status: DC | PRN
  Administered 2019-06-24 – 2019-06-26 (×4): 650 mg
  Filled 2019-06-24 (×6): qty 20.3

## 2019-06-24 MED ORDER — PANTOPRAZOLE SODIUM 40 MG PO PACK
40.0000 mg | PACK | Freq: Every day | ORAL | Status: DC
  Administered 2019-06-25 – 2019-06-30 (×6): 40 mg
  Filled 2019-06-24 (×7): qty 20

## 2019-06-24 MED ORDER — VITAL HIGH PROTEIN PO LIQD: 1000.0000 mL | ORAL | Status: DC

## 2019-06-24 MED ORDER — PRO-STAT SUGAR FREE PO LIQD: 30.0000 mL | Freq: Two times a day (BID) | ORAL | Status: DC

## 2019-06-24 MED ORDER — DEXTROSE 50 % IV SOLN
INTRAVENOUS | Status: AC
  Administered 2019-06-24: 25 mL via INTRAVENOUS
  Filled 2019-06-24: qty 50

## 2019-06-24 MED ORDER — PRO-STAT SUGAR FREE PO LIQD
30.0000 mL | Freq: Every day | ORAL | Status: DC
  Administered 2019-06-24 – 2019-06-30 (×7): 30 mL
  Filled 2019-06-24 (×7): qty 30

## 2019-06-24 MED ORDER — SELENIUM 200 MCG PO TABS
200.0000 ug | ORAL_TABLET | Freq: Every day | ORAL | Status: AC
  Administered 2019-06-24 – 2019-06-30 (×7): 200 ug
  Filled 2019-06-24 (×7): qty 1

## 2019-06-24 MED ORDER — PIVOT 1.5 CAL PO LIQD: 1000.0000 mL | ORAL | Status: DC

## 2019-06-24 MED ORDER — CLONAZEPAM 0.5 MG PO TABS
0.5000 mg | ORAL_TABLET | Freq: Two times a day (BID) | ORAL | Status: DC
  Administered 2019-06-24 (×2): 0.5 mg
  Filled 2019-06-24 (×3): qty 1

## 2019-06-24 NOTE — Progress Notes (Signed)
Subjective:    Patient intubated and sedated.   Objective:   VITALS:  Temp:  [98.8 F (37.1 C)-99.7 F (37.6 C)] 99.7 F (37.6 C) (11/06 0400) Pulse Rate:  [86-106] 95 (11/06 0600) Resp:  [16-19] 18 (11/06 0600) BP: (103-138)/(76-97) 114/79 (11/06 0600) SpO2:  [89 %-100 %] 97 % (11/06 0600) FiO2 (%):  [40 %] 40 % (11/06 0445)  Sling intact L UE Radial pulse 2+   LABS Recent Labs    06/22/19 1914 06/22/19 1925 06/22/19 2038 06/23/19 0423  HGB 12.3* 12.6* 11.9* 11.3*  WBC 11.6*  --   --  11.9*  PLT 329  --   --  278   Recent Labs    06/22/19 1914 06/22/19 1925 06/22/19 2038 06/23/19 0423  NA 140 142 139 138  K 4.0 4.0 4.1 4.1  CL 107 106  --  106  CO2 21*  --   --  23  BUN 21* 21*  --  19  CREATININE 1.22 1.10  --  1.02  GLUCOSE 141* 133*  --  126*   Recent Labs    06/22/19 1914  INR 1.0     Assessment/Plan:     L clavicle fx  -Continue with closed treatment for time being.  -Patient will need new plain films of the L clavicle when he is able to sit upright.  -L UE in sling at all times.    Mechele Claude PA-C [facility] Office:  [phone]

## 2019-06-24 NOTE — Progress Notes (Signed)
Pt place back on documented full support vent settings to rest for the night. FiO2 increased to 50%   Pt tolerating well.

## 2019-06-24 NOTE — Progress Notes (Signed)
Hypoglycemic Event  CBG: 63  Treatment: D50 25 mL (12.5 gm)  Symptoms: None  Follow-up CBG: CBJS:2831 CBG Result:118  Possible Reasons for Event: Inadequate meal intake  Comments/MD notified:tube feedings started    Jenne Campus

## 2019-06-24 NOTE — Progress Notes (Signed)
Neurosurgery Service Progress Note  Subjective: No acute events overnight   Objective: Vitals:   06/24/19 0748 06/24/19 0800 06/24/19 0813 06/24/19 0900  BP: 122/84 118/78  116/75  Pulse: 100 91 94 93  Resp: (!) '27 18 18 18  ' Temp:    99.7 F (37.6 C)  TempSrc:    Axillary  SpO2: 98% 97% 96% 96%  Weight:      Height:       Temp (24hrs), Avg:99.4 F (37.4 C), Min:99 F (37.2 C), Max:99.7 F (37.6 C)  CBC Latest Ref Rng & Units 06/24/2019 06/23/2019 06/22/2019  WBC 4.0 - 10.5 K/uL 15.4(H) 11.9(H) -  Hemoglobin 13.0 - 17.0 g/dL 10.5(L) 11.3(L) 11.9(L)  Hematocrit 39.0 - 52.0 % 32.4(L) 34.0(L) 35.0(L)  Platelets 150 - 400 K/uL 245 278 -   BMP Latest Ref Rng & Units 06/24/2019 06/23/2019 06/22/2019  Glucose 70 - 99 mg/dL 101(H) 126(H) -  BUN 6 - 20 mg/dL 13 19 -  Creatinine 0.61 - 1.24 mg/dL 0.89 1.02 -  Sodium 135 - 145 mmol/L 137 138 139  Potassium 3.5 - 5.1 mmol/L 4.0 4.1 4.1  Chloride 98 - 111 mmol/L 102 106 -  CO2 22 - 32 mmol/L 23 23 -  Calcium 8.9 - 10.3 mg/dL 8.8(L) 8.9 -    Intake/Output Summary (Last 24 hours) at 06/24/2019 0919 Last data filed at 06/24/2019 0800 Gross per 24 hour  Intake 3699.74 ml  Output 805 ml  Net 2894.74 ml    Current Facility-Administered Medications:  .  bacitracin ointment, , Topical, BID, Georganna Skeans, MD, 1 application at 74/16/38 2121 .  bisacodyl (DULCOLAX) suppository 10 mg, 10 mg, Rectal, Daily PRN, Ileana Roup, MD .  chlorhexidine gluconate (MEDLINE KIT) (PERIDEX) 0.12 % solution 15 mL, 15 mL, Mouth Rinse, BID, Ileana Roup, MD, 15 mL at 06/24/19 0757 .  Chlorhexidine Gluconate Cloth 2 % PADS 6 each, 6 each, Topical, Daily, Ileana Roup, MD, 6 each at 06/24/19 0325 .  clonazePAM (KLONOPIN) tablet 0.5 mg, 0.5 mg, Per Tube, BID, Georganna Skeans, MD .  docusate (COLACE) 50 MG/5ML liquid 100 mg, 100 mg, Per Tube, BID PRN, Ileana Roup, MD .  feeding supplement (PRO-STAT SUGAR FREE 64) liquid 30 mL, 30  mL, Per Tube, BID, Georganna Skeans, MD .  feeding supplement (VITAL HIGH PROTEIN) liquid 1,000 mL, 1,000 mL, Per Tube, Q24H, Georganna Skeans, MD .  fentaNYL (SUBLIMAZE) injection 50 mcg, 50 mcg, Intravenous, Q15 min PRN, Ileana Roup, MD .  fentaNYL (SUBLIMAZE) injection 50-200 mcg, 50-200 mcg, Intravenous, Q30 min PRN, Ileana Roup, MD, 200 mcg at 06/24/19 0325 .  lactated ringers infusion, , Intravenous, Continuous, Ileana Roup, MD, Last Rate: 125 mL/hr at 06/24/19 0800 .  MEDLINE mouth rinse, 15 mL, Mouth Rinse, 10 times per day, Ileana Roup, MD, 15 mL at 06/24/19 0325 .  metoprolol tartrate (LOPRESSOR) injection 5 mg, 5 mg, Intravenous, Q6H PRN, Ileana Roup, MD .  ondansetron (ZOFRAN-ODT) disintegrating tablet 4 mg, 4 mg, Oral, Q6H PRN **OR** ondansetron (ZOFRAN) injection 4 mg, 4 mg, Intravenous, Q6H PRN, Ileana Roup, MD .  pantoprazole (PROTONIX) injection 40 mg, 40 mg, Intravenous, Q24H, Georganna Skeans, MD, 40 mg at 06/23/19 1129 .  propofol (DIPRIVAN) 1000 MG/100ML infusion, 0-50 mcg/kg/min, Intravenous, Continuous, Ileana Roup, MD, Last Rate: 19.58 mL/hr at 06/24/19 0800, 40 mcg/kg/min at 06/24/19 0800 .  QUEtiapine (SEROQUEL) tablet 50 mg, 50 mg, Per Tube, BID, Georganna Skeans, MD .  selenium tablet 200 mcg, 200 mcg, Per Tube, Daily, Georganna Skeans, MD .  vitamin C (ASCORBIC ACID) tablet 1,000 mg, 1,000 mg, Per Tube, Q8H, Georganna Skeans, MD   Physical Exam: Intubated, sedation held, opens eyes and starts to MAEx4 purposefully, trying to d/c ETT  Assessment & Plan: 43 y.o. man s/p [facility] w/ tSAH and R>L temporal contusions, rpt CTH w/ expected evolution of contusions.  -no change in neurosurgical plan of care  Brandon Flowers  06/24/19 9:19 AM

## 2019-06-24 NOTE — Progress Notes (Signed)
Subjective: Still intubated, sedated.  Objective: Vital signs in last 24 hours: Temp:  [99 F (37.2 C)-99.7 F (37.6 C)] 99.7 F (37.6 C) (11/06 0900) Pulse Rate:  [86-106] 93 (11/06 0900) Resp:  [16-27] 18 (11/06 0900) BP: (109-138)/(75-97) 116/75 (11/06 0900) SpO2:  [89 %-100 %] 96 % (11/06 0900) FiO2 (%):  [40 %] 40 % (11/06 0813)  Physical Exam Constitutional: Intubated and sedated. He appearswell-developedand well-nourished.  Head:Multiple lacerations and abrasions. Staples in place. Right Ear: External earnormal.  Left Ear: Dry blood noted. TM could not be visualized. Nose:Normal mucosa. Mouth/Throat:Oral cavity and oropharynx are clear and moist. ET tube in place. Neck:No tracheal deviationpresent. No open wound. Cardiovascular:Normal rate,regular rhythm. Respiratory:Intubated. On vent.  Neurological: Sedated. Not responsive. Skin: Skin iswarmand dry. He isnot diaphoretic.  Recent Labs    06/23/19 0423 06/24/19 0705  WBC 11.9* 15.4*  HGB 11.3* 10.5*  HCT 34.0* 32.4*  PLT 278 245   Recent Labs    06/23/19 0423 06/24/19 0705  NA 138 137  K 4.1 4.0  CL 106 102  CO2 23 23  GLUCOSE 126* 101*  BUN 19 13  CREATININE 1.02 0.89  CALCIUM 8.9 8.8*    Medications:  I have reviewed the patient's current medications. Scheduled: . bacitracin   Topical BID  . chlorhexidine gluconate (MEDLINE KIT)  15 mL Mouth Rinse BID  . Chlorhexidine Gluconate Cloth  6 each Topical Daily  . clonazePAM  0.5 mg Per Tube BID  . mouth rinse  15 mL Mouth Rinse 10 times per day  . pantoprazole (PROTONIX) IV  40 mg Intravenous Q24H  . QUEtiapine  50 mg Per Tube BID  . selenium  200 mcg Per Tube Daily  . vitamin C  1,000 mg Per Tube Q8H   Continuous: . feeding supplement (PIVOT 1.5 CAL)    . lactated ringers 125 mL/hr at 06/24/19 0800  . propofol (DIPRIVAN) infusion 40 mcg/kg/min (06/24/19 0800)   CBJ:SEGBTDVVO, docusate, fentaNYL (SUBLIMAZE) injection, fentaNYL  (SUBLIMAZE) injection, metoprolol tartrate, ondansetron **OR** ondansetron (ZOFRAN) IV  Assessment/Plan: Transverse left temporal bone fracture. No obvious otic capsule involvement.  - No intervention needed at this time. - Will need to evaluate his hearing and facial nerve function when he is extubated and awake. - Will follow.   LOS: 2 days   Lindsay Straka W Pocahontas Cohenour 06/24/2019, 9:27 AM

## 2019-06-24 NOTE — Progress Notes (Signed)
Initial Nutrition Assessment  DOCUMENTATION CODES:   Not applicable  INTERVENTION:    Initiate Pivot 1.5 @ 40 ml/hr (960 ml/day) via OG tube 30 ml Prostat daily  Provides: 1540 kcal, 105 grams protein, and 728 ml free water.  TF regimen and propofol at current rate providing 2057 total kcal/day    NUTRITION DIAGNOSIS:   Increased nutrient needs related to wound healing as evidenced by estimated needs. Ongoing.   GOAL:   Patient will meet greater than or equal to 90% of their needs Progressing.   MONITOR:   Vent status  REASON FOR ASSESSMENT:   Consult, Ventilator Enteral/tube feeding initiation and management  ASSESSMENT:   Pt admitted after [facility] with small R SAH, L scalp lac repaired, L temporal bone fx, L rib fxs 3, 4, 5 and associated pulm contusion, L PTX s/p chest tube, L clavicle fxs, road rash, L lateral abd wall abrasion/contusion.   Pt discussed during ICU rounds and with RN.  Pt remains intubated and agitated.    Patient is currently intubated on ventilator support MV: 10.3 L/min Temp (24hrs), Avg:99.4 F (37.4 C), Min:99 F (37.2 C), Max:99.7 F (37.6 C)  Propofol: 19 ml/hr (40 mcg) provides: 501 kcal  Medications and labs reviewed   CT: 20 ml   NUTRITION - FOCUSED PHYSICAL EXAM:  WNL  Diet Order:   Diet Order            Diet NPO time specified  Diet effective now              EDUCATION NEEDS:   No education needs have been identified at this time  Skin:  Skin Assessment: Reviewed RN Assessment  Last BM:  unknown  Height:   Ht Readings from Last 1 Encounters:  06/22/19 5\' 11"  (1.803 m)    Weight:   Wt Readings from Last 1 Encounters:  06/22/19 70.9 kg    Ideal Body Weight:  78.1 kg  BMI:  Body mass index is 21.8 kg/m.  Estimated Nutritional Needs:   Kcal:  1955  Protein:  105-125 grams  Fluid:  > 1.9 L/day   Maylon Peppers RD, LDN, CNSC [phone] Pager [phone] After Hours Pager

## 2019-06-24 NOTE — Progress Notes (Signed)
Patient ID: Brandon Flowers, male   DOB: [birth year], 43 y.o.   MRN: [id number] Follow up - Trauma Critical Care  Patient Details:    Brandon Flowers is an 43 y.o. male.  Lines/tubes : Airway 7.5 mm (Active)  Secured at (cm) 25 cm 06/24/19 0813  Measured From Lips 06/24/19 0813  Secured Location Center 06/24/19 0813  Secured By [employer] 06/24/19 0813  Tube Holder Repositioned Yes 06/24/19 0813  Cuff Pressure (cm H2O) 28 cm H2O 06/24/19 0813  Site Condition Dry 06/24/19 0813     Chest Tube Lateral;Left Pleural (Active)  Status -20 cm H2O 06/24/19 0759  Chest Tube Air Leak None 06/24/19 0759  Drainage Description Serosanguineous 06/24/19 0759  Dressing Status Clean;Dry;Intact 06/24/19 0759  Output (mL) 0 mL 06/24/19 0759     NG/OG Tube Orogastric 18 Fr. Center mouth Aucultation;Xray (Active)  Site Assessment Clean;Dry;Intact 06/24/19 0759  Ongoing Placement Verification No acute changes, not attributed to clinical condition;No change in cm markings or external length of tube from initial placement;No change in respiratory status;Xray 06/24/19 0759  Status Suction-low intermittent 06/24/19 0759  Amount of suction 90 mmHg 06/24/19 0759  Drainage Appearance Bile 06/23/19 2000     Urethral Catheter Matilde Haymaker, RN Straight-tip 16 Fr. (Active)  Indication for Insertion or Continuance of Catheter Acute urinary retention (I&O Cath for 24 hrs prior to catheter insertion- Inpatient Only);Unstable critically ill patients first 24-48 hours (See Criteria) 06/24/19 0759  Site Assessment Clean;Intact 06/24/19 0759  Catheter Maintenance Bag below level of bladder;Catheter secured;Drainage bag/tubing not touching floor;Insertion date on drainage bag;No dependent loops;Seal intact 06/24/19 0759  Collection Container Standard drainage bag 06/24/19 0759  Securement Method Securing device (Describe) 06/24/19 0759  Urinary Catheter Interventions (if applicable) Unclamped 30/16/01  0759  Output (mL) 50 mL 06/24/19 0600    Microbiology/Sepsis markers: Results for orders placed or performed during the hospital encounter of 06/22/19  SARS CORONAVIRUS 2 (TAT 6-24 HRS) Nasopharyngeal Nasopharyngeal Swab     Status: None   Collection Time: 06/22/19  7:44 PM   Specimen: Nasopharyngeal Swab  Result Value Ref Range Status   SARS Coronavirus 2 NEGATIVE NEGATIVE Final    Comment: (NOTE) SARS-CoV-2 target nucleic acids are NOT DETECTED. The SARS-CoV-2 RNA is generally detectable in upper and lower respiratory specimens during the acute phase of infection. Negative results do not preclude SARS-CoV-2 infection, do not rule out co-infections with other pathogens, and should not be used as the sole basis for treatment or other patient management decisions. Negative results must be combined with clinical observations, patient history, and epidemiological information. The expected result is Negative. Fact Sheet for Patients: SugarRoll.be Fact Sheet for Healthcare Providers: https://www.woods-mathews.com/ This test is not yet approved or cleared by the [place] FDA and  has been authorized for detection and/or diagnosis of SARS-CoV-2 by FDA under an Emergency Use Authorization (EUA). This EUA will remain  in effect (meaning this test can be used) for the duration of the COVID-19 declaration under Section 56 4(b)(1) of the Act, 21 U.S.C. section 360bbb-3(b)(1), unless the authorization is terminated or revoked sooner. Performed at [facility], [place] [address], [place],  [address]   MRSA PCR Screening     Status: None   Collection Time: 06/22/19 11:59 PM   Specimen: Nasal Mucosa; Nasopharyngeal  Result Value Ref Range Status   MRSA by PCR NEGATIVE NEGATIVE Final    Comment:        The GeneXpert MRSA Assay (FDA  approved for NASAL specimens only), is one component of a comprehensive MRSA colonization surveillance  program. It is not intended to diagnose MRSA infection nor to guide or monitor treatment for MRSA infections. Performed at [facility], [address] [place], [place] [address]     Anti-infectives:  Anti-infectives (From admission, onward)   None      Best Practice/Protocols:  VTE Prophylaxis: Mechanical Continous Sedation  Consults: Treatment Team:  Md, Trauma, MD Jadene Pierinistergard, Gyan A, MD Toni ArthursHewitt, John, MD Newman Pieseoh, Su, MD    Studies:    Events:  Subjective:    Overnight Issues:   Objective:  Vital signs for last 24 hours: Temp:  [99 F (37.2 C)-99.7 F (37.6 C)] 99.7 F (37.6 C) (11/06 0900) Pulse Rate:  [86-106] 93 (11/06 0900) Resp:  [16-27] 18 (11/06 0900) BP: (109-138)/(75-97) 116/75 (11/06 0900) SpO2:  [89 %-100 %] 96 % (11/06 0900) FiO2 (%):  [40 %] 40 % (11/06 0813)  Hemodynamic parameters for last 24 hours:    Intake/Output from previous day: 11/05 0701 - 11/06 0700 In: 3421.9 [I.V.:3171.9; IV Piggyback:250] Out: 805 [Urine:785; Chest Tube:20]  Intake/Output this shift: Total I/O In: 277.8 [I.V.:277.8] Out: 0   Vent settings for last 24 hours: Vent Mode: PRVC FiO2 (%):  [40 %] 40 % Set Rate:  [18 bmp] 18 bmp Vt Set:  [600 mL] 600 mL PEEP:  [5 cmH20] 5 cmH20 Plateau Pressure:  [14 cmH20-16 cmH20] 15 cmH20  Physical Exam:  General: no respiratory distress Neuro: arouses and is agitated, MAE HEENT/Neck: ETT Resp: clear to auscultation bilaterally CVS: RRR GI: soft, nontender, BS WNL, no r/g Extremities: calves soft Wound L flank cleaner  Results for orders placed or performed during the hospital encounter of 06/22/19 (from the past 24 hour(s))  Triglycerides     Status: None   Collection Time: 06/24/19  7:05 AM  Result Value Ref Range   Triglycerides 74 <150 mg/dL  CBC in AM     Status: Abnormal   Collection Time: 06/24/19  7:05 AM  Result Value Ref Range   WBC 15.4 (H) 4.0 - 10.5 K/uL   RBC 3.35 (L) 4.22 - 5.81 MIL/uL    Hemoglobin 10.5 (L) 13.0 - 17.0 g/dL   HCT 86.532.4 (L) 78.439.0 - 69.652.0 %   MCV 96.7 80.0 - 100.0 fL   MCH 31.3 26.0 - 34.0 pg   MCHC 32.4 30.0 - 36.0 g/dL   RDW 29.513.6 28.411.5 - 13.215.5 %   Platelets 245 150 - 400 K/uL   nRBC 0.0 0.0 - 0.2 %  Basic metabolic panel in AM     Status: Abnormal   Collection Time: 06/24/19  7:05 AM  Result Value Ref Range   Sodium 137 135 - 145 mmol/L   Potassium 4.0 3.5 - 5.1 mmol/L   Chloride 102 98 - 111 mmol/L   CO2 23 22 - 32 mmol/L   Glucose, Bld 101 (H) 70 - 99 mg/dL   BUN 13 6 - 20 mg/dL   Creatinine, Ser 4.400.89 0.61 - 1.24 mg/dL   Calcium 8.8 (L) 8.9 - 10.3 mg/dL   GFR calc non Af Amer >60 >60 mL/min   GFR calc Af Amer >60 >60 mL/min   Anion gap 12 5 - 15    Assessment & Plan: Present on Admission: **None**    LOS: 2 days   Additional comments:I reviewed the patient's new clinical lab test results. Dolphus Jenny. Small R SAH - I D/W Dr. Maurice Smallstergard on  the unit, F/U CT H today stable. Agitation from this. Add klon/sero L scalp lac - repaired in ED L temporal bone fx - I D/W Dr. Suszanne Conners on the unit, appreciate his input L Rib fxs 3, 4, 5 and associated pulm contusion - multimodal pain control L PTX associated with above - larger on CXR 11/5 so chest tube placed which resolved it. CXR in AM and H2O seal then if lung up. Minimal output. L clavicle - Dr. Victorino Dike consulted and following, non-op for now Road rash - bacitracin ABL anemia L lateral abd wall abrasion/contusion - wet to dry gauze,a bit cleaner today VTE: SCDs; holding chemical dvt ppx until 48h S/P stable F/U CT head given acute SAH FEN - tube feeds, klon/sero, Na and K OK Dispo: 4N ICU Critical Care Total Time*: 35 Minutes  Violeta Gelinas, MD, MPH, FACS Trauma & General Surgery Use [email or web address] to contact on call provider  06/24/2019  *Care during the described time interval was provided by me. I have reviewed this patient's available data, including medical history, events of note, physical examination and  test results as part of my evaluation.

## 2019-06-24 NOTE — Progress Notes (Signed)
Pt has temp 101.3. Md made aware, orders obtained will continue to monitor.

## 2019-06-25 ENCOUNTER — Inpatient Hospital Stay (HOSPITAL_COMMUNITY): Payer: Self-pay

## 2019-06-25 ENCOUNTER — Inpatient Hospital Stay: Payer: Self-pay

## 2019-06-25 LAB — BASIC METABOLIC PANEL
Anion gap: 10 (ref 5–15)
BUN: 12 mg/dL (ref 6–20)
CO2: 25 mmol/L (ref 22–32)
Calcium: 8.1 mg/dL — ABNORMAL LOW (ref 8.9–10.3)
Chloride: 98 mmol/L (ref 98–111)
Creatinine, Ser: 0.7 mg/dL (ref 0.61–1.24)
GFR calc Af Amer: >60 mL/min (ref >60)
GFR calc non Af Amer: >60 mL/min (ref >60)
Glucose, Bld: 152 mg/dL — ABNORMAL HIGH (ref 70–99)
Potassium: 3.9 mmol/L (ref 3.5–5.1)
Sodium: 133 mmol/L — ABNORMAL LOW (ref 135–145)

## 2019-06-25 LAB — CBC
HCT: 30 % — ABNORMAL LOW (ref 39.0–52.0)
Hemoglobin: 9.9 g/dL — ABNORMAL LOW (ref 13.0–17.0)
MCH: 31.7 pg (ref 26.0–34.0)
MCHC: 33 g/dL (ref 30.0–36.0)
MCV: 96.2 fL (ref 80.0–100.0)
Platelets: 219 10*3/uL (ref 150–400)
RBC: 3.12 MIL/uL — ABNORMAL LOW (ref 4.22–5.81)
RDW: 13.4 % (ref 11.5–15.5)
WBC: 7.8 10*3/uL (ref 4.0–10.5)
nRBC: 0 % (ref 0.0–0.2)

## 2019-06-25 LAB — MAGNESIUM
Magnesium: 1.8 mg/dL (ref 1.7–2.4)
Magnesium: 1.8 mg/dL (ref 1.7–2.4)

## 2019-06-25 LAB — TRIGLYCERIDES: Triglycerides: 74 mg/dL (ref <150)

## 2019-06-25 LAB — PHOSPHORUS
Phosphorus: 1.8 mg/dL — ABNORMAL LOW (ref 2.5–4.6)
Phosphorus: 1.2 mg/dL — ABNORMAL LOW (ref 2.5–4.6)

## 2019-06-25 LAB — GLUCOSE, CAPILLARY
Glucose-Capillary: 104 mg/dL — ABNORMAL HIGH (ref 70–99)
Glucose-Capillary: 122 mg/dL — ABNORMAL HIGH (ref 70–99)
Glucose-Capillary: 134 mg/dL — ABNORMAL HIGH (ref 70–99)
Glucose-Capillary: 141 mg/dL — ABNORMAL HIGH (ref 70–99)
Glucose-Capillary: 143 mg/dL — ABNORMAL HIGH (ref 70–99)
Glucose-Capillary: 144 mg/dL — ABNORMAL HIGH (ref 70–99)
Glucose-Capillary: 149 mg/dL — ABNORMAL HIGH (ref 70–99)

## 2019-06-25 MED ORDER — CLONAZEPAM 1 MG PO TABS
1.0000 mg | ORAL_TABLET | Freq: Two times a day (BID) | ORAL | Status: DC
  Administered 2019-06-25 – 2019-07-01 (×13): 1 mg
  Filled 2019-06-25 (×13): qty 1

## 2019-06-25 MED ORDER — SODIUM CHLORIDE 0.9 % IV SOLN
INTRAVENOUS | Status: DC
  Administered 2019-06-25: 11:00:00 via INTRAVENOUS

## 2019-06-25 MED ORDER — QUETIAPINE FUMARATE 100 MG PO TABS
100.0000 mg | ORAL_TABLET | Freq: Two times a day (BID) | ORAL | Status: DC
  Administered 2019-06-25 – 2019-06-27 (×5): 100 mg
  Filled 2019-06-25 (×5): qty 1

## 2019-06-25 MED ORDER — SODIUM CHLORIDE 0.9% FLUSH
10.0000 mL | Freq: Two times a day (BID) | INTRAVENOUS | Status: DC
  Administered 2019-06-25 – 2019-06-27 (×5): 10 mL

## 2019-06-25 MED ORDER — SODIUM CHLORIDE 0.9% FLUSH: 10.0000 mL | INTRAVENOUS | Status: DC | PRN

## 2019-06-25 NOTE — Progress Notes (Signed)
Subjective: Intubated, sedated, on vent.  Objective: Vital signs in last 24 hours: Temp:  [98.8 F (37.1 C)-102.2 F (39 C)] 100.2 F (37.9 C) (11/07 0800) Pulse Rate:  [91-126] 102 (11/07 0807) Resp:  [17-24] 17 (11/07 0807) BP: (97-139)/(65-94) 139/94 (11/07 0807) SpO2:  [92 %-100 %] 93 % (11/07 0807) FiO2 (%):  [40 %-50 %] 40 % (11/07 0807)  Physical Exam Constitutional:Intubated and sedated.He appearswell-developedand well-nourished.  Head:Multiple lacerations and abrasions. Staples in place. Right Ear: External earnormal.  Left Ear: Dry blood noted. TM could not be visualized. Nose:Normalmucosa. Mouth/Throat:Oral cavity and oropharynxareclear and moist. ET tube in place. Neck:No tracheal deviationpresent.No open wound. Cardiovascular:Normal rate,regular rhythm. Respiratory:Intubated. On vent. Neurological: Sedated. Not responsive. Skin: Skin iswarmand dry. He isnot diaphoretic.  Recent Labs    06/24/19 0705 06/25/19 0438  WBC 15.4* 7.8  HGB 10.5* 9.9*  HCT 32.4* 30.0*  PLT 245 219   Recent Labs    06/24/19 0705 06/25/19 0438  NA 137 133*  K 4.0 3.9  CL 102 98  CO2 23 25  GLUCOSE 101* 152*  BUN 13 12  CREATININE 0.89 0.70  CALCIUM 8.8* 8.1*    Medications:  I have reviewed the patient's current medications. Scheduled: . bacitracin   Topical BID  . chlorhexidine gluconate (MEDLINE KIT)  15 mL Mouth Rinse BID  . Chlorhexidine Gluconate Cloth  6 each Topical Daily  . clonazePAM  1 mg Per Tube BID  . feeding supplement (PRO-STAT SUGAR FREE 64)  30 mL Per Tube Daily  . mouth rinse  15 mL Mouth Rinse 10 times per day  . pantoprazole sodium  40 mg Per Tube Daily  . QUEtiapine  100 mg Per Tube BID  . selenium  200 mcg Per Tube Daily  . vitamin C  1,000 mg Per Tube Q8H   Continuous: . sodium chloride    . feeding supplement (PIVOT 1.5 CAL) 1,000 mL (06/24/19 1442)  . propofol (DIPRIVAN) infusion 43 mcg/kg/min (06/25/19 0800)     Assessment/Plan: Transverse left temporal bone fracture. No obvious otic capsule involvement.  - Will need to evaluate his hearing and facial nerve function when he is extubated and awake.   LOS: 3 days   Yalonda Sample W Fredericka Bottcher 06/25/2019, 9:04 AM

## 2019-06-25 NOTE — Progress Notes (Signed)
Attempted to call mother for PICC consent.  No answer at available phone number.

## 2019-06-25 NOTE — Progress Notes (Signed)
Spoke with Jinny Blossom RN re PICC order.  Pt unable to sign, restless in restraints for agitation.  Will attempt to obtain consent from mother. Per Jinny Blossom RN has 2 PIV working well at this time.

## 2019-06-25 NOTE — Progress Notes (Signed)
Peripherally Inserted Central Catheter/Midline Placement  The IV Nurse has discussed with the patient and/or persons authorized to consent for the patient, the purpose of this procedure and the potential benefits and risks involved with this procedure.  The benefits include less needle sticks, lab draws from the catheter, and the patient may be discharged home with the catheter. Risks include, but not limited to, infection, bleeding, blood clot (thrombus formation), and puncture of an artery; nerve damage and irregular heartbeat and possibility to perform a PICC exchange if needed/ordered by physician.  Alternatives to this procedure were also discussed.  B[employer] Power PICC patient education guide, fact sheet on infection prevention and patient information card has been provided to patient /or left at bedside.  Mother signed consent at bedside.  PICC/Midline Placement Documentation  PICC Double Lumen 06/25/19 PICC Right Brachial 41 cm 0 cm (Active)  Indication for Insertion or Continuance of Line Prolonged intravenous therapies 06/25/19 1500  Exposed Catheter (cm) 0 cm 06/25/19 1500  Site Assessment Clean;Dry;Intact 06/25/19 1500  Lumen #1 Status Flushed;Blood return noted;Saline locked 06/25/19 1500  Lumen #2 Status Flushed;Blood return noted;Saline locked 06/25/19 1500  Dressing Type Transparent 06/25/19 1500  Dressing Status Clean;Dry;Intact;Antimicrobial disc in place 06/25/19 1500  [place] checked and tightened 06/25/19 1500  Line Adjustment (NICU/IV Team Only) No 06/25/19 1500  Dressing Intervention New dressing 06/25/19 1500  Dressing Change Due 07/02/19 06/25/19 1500       Brandon Flowers 06/25/2019, 3:45 PM

## 2019-06-25 NOTE — Progress Notes (Signed)
Brandon Flowers  MRN: [id number] DOB/Age: 1975/10/19 43 y.o. Physician: Ander Slade, M.D.     Subjective: Patient remains intubated/sedated.  Nursing staff does report purposeful movements have been noted. Vital Signs Temp:  [98.8 F (37.1 C)-102.2 F (39 C)] 100.2 F (37.9 C) (11/07 0800) Pulse Rate:  [91-126] 102 (11/07 0807) Resp:  [17-24] 17 (11/07 0807) BP: (97-139)/(65-94) 139/94 (11/07 0807) SpO2:  [92 %-100 %] 93 % (11/07 0807) FiO2 (%):  [40 %-50 %] 40 % (11/07 0807)  Lab Results Recent Labs    06/24/19 0705 06/25/19 0438  WBC 15.4* 7.8  HGB 10.5* 9.9*  HCT 32.4* 30.0*  PLT 245 219   BMET Recent Labs    06/24/19 0705 06/25/19 0438  NA 137 133*  K 4.0 3.9  CL 102 98  CO2 23 25  GLUCOSE 101* 152*  BUN 13 12  CREATININE 0.89 0.70  CALCIUM 8.8* 8.1*   INR  Date Value Ref Range Status  06/22/2019 1.0 0.8 - 1.2 Final    Comment:    (NOTE) INR goal varies based on device and disease states. Performed at [facility], [place] [address], [place], [place] [address]      Exam  Left upper extremity maintained in sling.  Superficial abrasions about the left shoulder protected by Mepilex dressing.  Dedicated clavicle films are not available.  Chest x-ray does reveal left clavicle fracture with overall alignment and shortening difficult to ascertain  Plan Secondary survey and dedicated clavicle films when patient extubated/cooperative Attila Mccarthy M Josel Keo 06/25/2019, 10:46 AM    Contact # [phone]

## 2019-06-25 NOTE — Progress Notes (Signed)
Patient ID: Brandon Flowers, male   DOB: 03-31-1976, 43 y.o.   MRN: [id number] BP 124/69 Comment: Simultaneous filing. User may not have seen previous data.  Pulse (!) 120 Comment: Simultaneous filing. User may not have seen previous data.  Temp (!) 102.5 F (39.2 C) (Axillary) Comment: Nurse notified  Resp 20 Comment: Simultaneous filing. User may not have seen previous data.  Ht 5\' 11"  (1.803 m)   Wt 70.9 kg   SpO2 93% Comment: Simultaneous filing. User may not have seen previous data.  BMI 21.80 kg/m  Sedated, intubated Neurological exam is stable, moving all extremities  not following commands

## 2019-06-25 NOTE — Progress Notes (Signed)
Consent obtained from mother at bedside.  States has hx of R shoulder "problems" but did not know what.  Stated he could not afford healthcare so he did not follow up on it.  RA assessed, no abnormalities visible, good ROM.

## 2019-06-25 NOTE — Progress Notes (Signed)
Patient ID: SUVAN STCYR, male   DOB: July 17, 1976, 43 y.o.   MRN: [id number] Follow up - Trauma Critical Care  Patient Details:    Brandon Flowers is an 43 y.o. male.  Lines/tubes : Airway 7.5 mm (Active)  Secured at (cm) 25 cm 06/25/19 0321  Measured From Lips 06/25/19 0321  Secured Location Left 06/25/19 0321  Secured By [employer] 06/25/19 0321  Tube Holder Repositioned Yes 06/25/19 0321  Cuff Pressure (cm H2O) 26 cm H2O 06/24/19 1945  Site Condition Dry 06/25/19 0321     Chest Tube Lateral;Left Pleural (Active)  Status -20 cm H2O 06/25/19 0526  Chest Tube Air Leak None 06/25/19 0526  Drainage Description Serosanguineous 06/25/19 0526  Dressing Status Clean;Dry;Intact 06/25/19 0526  Output (mL) 4 mL 06/25/19 0526     NG/OG Tube Orogastric 18 Fr. Center mouth Aucultation;Xray (Active)  External Length of Tube (cm) - (if applicable) 65 cm 40/98/11 2000  Site Assessment Clean;Dry;Intact 06/25/19 0400  Ongoing Placement Verification No change in cm markings or external length of tube from initial placement;No change in respiratory status;No acute changes, not attributed to clinical condition 06/25/19 0400  Status Infusing tube feed 06/25/19 0400  Amount of suction 90 mmHg 06/24/19 1900  Drainage Appearance Bile 06/23/19 2000  Intake (mL) 50 mL 06/24/19 1200     Urethral Catheter Matilde Haymaker, RN Straight-tip 16 Fr. (Active)  Indication for Insertion or Continuance of Catheter Acute urinary retention (I&O Cath for 24 hrs prior to catheter insertion- Inpatient Only);Unstable critically ill patients first 24-48 hours (See Criteria) 06/25/19 0400  Site Assessment Clean;Intact 06/25/19 0400  Catheter Maintenance Bag below level of bladder;Catheter secured;Drainage bag/tubing not touching floor;Insertion date on drainage bag;No dependent loops;Seal intact 06/25/19 0400  Collection Container Standard drainage bag 06/25/19 0400  Securement Method Securing device  (Describe) 06/25/19 0400  Urinary Catheter Interventions (if applicable) Unclamped 91/47/82 0400  Output (mL) 100 mL 06/25/19 0600    Microbiology/Sepsis markers: Results for orders placed or performed during the hospital encounter of 06/22/19  SARS CORONAVIRUS 2 (TAT 6-24 HRS) Nasopharyngeal Nasopharyngeal Swab     Status: None   Collection Time: 06/22/19  7:44 PM   Specimen: Nasopharyngeal Swab  Result Value Ref Range Status   SARS Coronavirus 2 NEGATIVE NEGATIVE Final    Comment: (NOTE) SARS-CoV-2 target nucleic acids are NOT DETECTED. The SARS-CoV-2 RNA is generally detectable in upper and lower respiratory specimens during the acute phase of infection. Negative results do not preclude SARS-CoV-2 infection, do not rule out co-infections with other pathogens, and should not be used as the sole basis for treatment or other patient management decisions. Negative results must be combined with clinical observations, patient history, and epidemiological information. The expected result is Negative. Fact Sheet for Patients: SugarRoll.be Fact Sheet for Healthcare Providers: https://www.woods-mathews.com/ This test is not yet approved or cleared by the [place] FDA and  has been authorized for detection and/or diagnosis of SARS-CoV-2 by FDA under an Emergency Use Authorization (EUA). This EUA will remain  in effect (meaning this test can be used) for the duration of the COVID-19 declaration under Section 56 4(b)(1) of the Act, 21 U.S.C. section 360bbb-3(b)(1), unless the authorization is terminated or revoked sooner. Performed at [facility], [place] [address] [place],  [address]   MRSA PCR Screening     Status: None   Collection Time: 06/22/19 11:59 PM   Specimen: Nasal Mucosa; Nasopharyngeal  Result Value Ref Range Status   MRSA  by PCR NEGATIVE NEGATIVE Final    Comment:        The GeneXpert MRSA Assay (FDA approved for  NASAL specimens only), is one component of a comprehensive MRSA colonization surveillance program. It is not intended to diagnose MRSA infection nor to guide or monitor treatment for MRSA infections. Performed at [facility], [address], [place], [place] [address]     Anti-infectives:  Anti-infectives (From admission, onward)   None      Best Practice/Protocols:  VTE Prophylaxis: Mechanical Continous Sedation  Consults: Treatment Team:  Md, Trauma, MD Jadene Pierini, MD Toni Arthurs, MD Newman Pies, MD    Studies:    Events:  Subjective:    Overnight Issues:   Objective:  Vital signs for last 24 hours: Temp:  [98.8 F (37.1 C)-102.2 F (39 C)] 100.1 F (37.8 C) (11/07 0400) Pulse Rate:  [91-114] 102 (11/07 0700) Resp:  [18-24] 21 (11/07 0700) BP: (97-127)/(65-89) 113/74 (11/07 0700) SpO2:  [92 %-100 %] 93 % (11/07 0700) FiO2 (%):  [40 %-50 %] 40 % (11/07 0321)  Hemodynamic parameters for last 24 hours:    Intake/Output from previous day: 11/06 0701 - 11/07 0700 In: 4415.7 [I.V.:3593.7; NG/GT:822] Out: 1129 [Urine:1125; Chest Tube:4]  Intake/Output this shift: No intake/output data recorded.  Vent settings for last 24 hours: Vent Mode: PRVC FiO2 (%):  [40 %-50 %] 40 % Set Rate:  [18 bmp] 18 bmp Vt Set:  [600 mL] 600 mL PEEP:  [5 cmH20] 5 cmH20 Pressure Support:  [10 cmH20] 10 cmH20 Plateau Pressure:  [14 cmH20-16 cmH20] 16 cmH20  Physical Exam:  General: no respiratory distress Neuro: agitated and MAE, not F/C HEENT/Neck: ETT Resp: clear to auscultation bilaterally CVS: regular rate and rhythm, S1, S2 normal, no murmur, click, rub or gallop GI: soft, nontender, BS WNL, no r/g and L abd wall wound stable Extremities: no edema, no erythema, pulses WNL  Results for orders placed or performed during the hospital encounter of 06/22/19 (from the past 24 hour(s))  Magnesium     Status: None   Collection Time: 06/24/19  9:26 AM   Result Value Ref Range   Magnesium 1.8 1.7 - 2.4 mg/dL  Phosphorus     Status: None   Collection Time: 06/24/19  9:26 AM  Result Value Ref Range   Phosphorus 2.9 2.5 - 4.6 mg/dL  Glucose, capillary     Status: None   Collection Time: 06/24/19  1:48 PM  Result Value Ref Range   Glucose-Capillary 78 70 - 99 mg/dL  Glucose, capillary     Status: Abnormal   Collection Time: 06/24/19  3:47 PM  Result Value Ref Range   Glucose-Capillary 63 (L) 70 - 99 mg/dL  Magnesium     Status: None   Collection Time: 06/24/19  4:55 PM  Result Value Ref Range   Magnesium 1.8 1.7 - 2.4 mg/dL  Phosphorus     Status: Abnormal   Collection Time: 06/24/19  4:55 PM  Result Value Ref Range   Phosphorus 1.7 (L) 2.5 - 4.6 mg/dL  Glucose, capillary     Status: Abnormal   Collection Time: 06/24/19  4:57 PM  Result Value Ref Range   Glucose-Capillary 118 (H) 70 - 99 mg/dL  Glucose, capillary     Status: Abnormal   Collection Time: 06/24/19  8:29 PM  Result Value Ref Range   Glucose-Capillary 108 (H) 70 - 99 mg/dL   Comment 1 Notify RN  Comment 2 Document in Chart   Glucose, capillary     Status: Abnormal   Collection Time: 06/24/19 11:53 PM  Result Value Ref Range   Glucose-Capillary 144 (H) 70 - 99 mg/dL  Glucose, capillary     Status: Abnormal   Collection Time: 06/25/19  4:00 AM  Result Value Ref Range   Glucose-Capillary 143 (H) 70 - 99 mg/dL  CBC in AM     Status: Abnormal   Collection Time: 06/25/19  4:38 AM  Result Value Ref Range   WBC 7.8 4.0 - 10.5 K/uL   RBC 3.12 (L) 4.22 - 5.81 MIL/uL   Hemoglobin 9.9 (L) 13.0 - 17.0 g/dL   HCT 16.130.0 (L) 09.639.0 - 04.552.0 %   MCV 96.2 80.0 - 100.0 fL   MCH 31.7 26.0 - 34.0 pg   MCHC 33.0 30.0 - 36.0 g/dL   RDW 40.913.4 81.111.5 - 91.415.5 %   Platelets 219 150 - 400 K/uL   nRBC 0.0 0.0 - 0.2 %  Basic metabolic panel in AM     Status: Abnormal   Collection Time: 06/25/19  4:38 AM  Result Value Ref Range   Sodium 133 (L) 135 - 145 mmol/L   Potassium 3.9 3.5 - 5.1  mmol/L   Chloride 98 98 - 111 mmol/L   CO2 25 22 - 32 mmol/L   Glucose, Bld 152 (H) 70 - 99 mg/dL   BUN 12 6 - 20 mg/dL   Creatinine, Ser 7.820.70 0.61 - 1.24 mg/dL   Calcium 8.1 (L) 8.9 - 10.3 mg/dL   GFR calc non Af Amer >60 >60 mL/min   GFR calc Af Amer >60 >60 mL/min   Anion gap 10 5 - 15  Magnesium     Status: None   Collection Time: 06/25/19  4:38 AM  Result Value Ref Range   Magnesium 1.8 1.7 - 2.4 mg/dL  Phosphorus     Status: Abnormal   Collection Time: 06/25/19  4:38 AM  Result Value Ref Range   Phosphorus 1.8 (L) 2.5 - 4.6 mg/dL  Triglycerides     Status: None   Collection Time: 06/25/19  4:38 AM  Result Value Ref Range   Triglycerides 74 <150 mg/dL    Assessment & Plan: Present on Admission: **None**    LOS: 3 days   Additional comments:I reviewed the patient's new clinical lab test results. and CXR Small R SAH - perDr. Maurice Smallstergard on the unit, F/U CT H today stable 11/6. Agitation from this. Increase klon/sero L scalp lac - repaired in ED L temporal bone fx - I D/W Dr. Suszanne Connerseoh on the unit 11/6, appreciate his input L Rib fxs 3, 4, 5 and associated pulm contusion - multimodal pain control L PTX associated with above - larger on CXR 11/5 so chest tube placed which resolved it. CXR today no PTX - place on H2O seal L clavicle - Dr. Victorino DikeHewitt consulted and following, non-op for now Road rash - bacitracin ABL anemia L lateral abd wall abrasion/contusion - wet to dry gauze VTE: SCDs; holding chemical dvt ppx until 48h S/P stable F/U CT head given acute SAH FEN - tube feeds, increase klon/sero, Na 133 LR D/Cd. 0.9NS KVO Dispo: 4N ICU Critical Care Total Time*: 4635 Minutes  Violeta GelinasBurke Jalaila Caradonna, MD, MPH, FACS Trauma & General Surgery Use [email or web address] to contact on call provider  06/25/2019  *Care during the described time interval was provided by me. I have reviewed this patient's available data, including medical history, events of  note, physical examination and test results as part  of my evaluation.

## 2019-06-26 ENCOUNTER — Inpatient Hospital Stay (HOSPITAL_COMMUNITY): Payer: Self-pay

## 2019-06-26 LAB — BASIC METABOLIC PANEL
Anion gap: 8 (ref 5–15)
BUN: 15 mg/dL (ref 6–20)
CO2: 28 mmol/L (ref 22–32)
Calcium: 8 mg/dL — ABNORMAL LOW (ref 8.9–10.3)
Chloride: 98 mmol/L (ref 98–111)
Creatinine, Ser: 0.75 mg/dL (ref 0.61–1.24)
GFR calc Af Amer: >60 mL/min (ref >60)
GFR calc non Af Amer: >60 mL/min (ref >60)
Glucose, Bld: 166 mg/dL — ABNORMAL HIGH (ref 70–99)
Potassium: 3.7 mmol/L (ref 3.5–5.1)
Sodium: 134 mmol/L — ABNORMAL LOW (ref 135–145)

## 2019-06-26 LAB — GLUCOSE, CAPILLARY
Glucose-Capillary: 112 mg/dL — ABNORMAL HIGH (ref 70–99)
Glucose-Capillary: 120 mg/dL — ABNORMAL HIGH (ref 70–99)
Glucose-Capillary: 128 mg/dL — ABNORMAL HIGH (ref 70–99)
Glucose-Capillary: 134 mg/dL — ABNORMAL HIGH (ref 70–99)
Glucose-Capillary: 148 mg/dL — ABNORMAL HIGH (ref 70–99)
Glucose-Capillary: 96 mg/dL (ref 70–99)

## 2019-06-26 LAB — CBC
HCT: 27.2 % — ABNORMAL LOW (ref 39.0–52.0)
Hemoglobin: 9.4 g/dL — ABNORMAL LOW (ref 13.0–17.0)
MCH: 33.1 pg (ref 26.0–34.0)
MCHC: 34.6 g/dL (ref 30.0–36.0)
MCV: 95.8 fL (ref 80.0–100.0)
Platelets: 210 10*3/uL (ref 150–400)
RBC: 2.84 MIL/uL — ABNORMAL LOW (ref 4.22–5.81)
RDW: 13.5 % (ref 11.5–15.5)
WBC: 8.5 10*3/uL (ref 4.0–10.5)
nRBC: 0 % (ref 0.0–0.2)

## 2019-06-26 LAB — TRIGLYCERIDES: Triglycerides: 628 mg/dL — ABNORMAL HIGH (ref <150)

## 2019-06-26 MED ORDER — DEXMEDETOMIDINE HCL IN NACL 400 MCG/100ML IV SOLN
0.4000 ug/kg/h | INTRAVENOUS | Status: DC
  Administered 2019-06-26: 1.5 ug/kg/h via INTRAVENOUS
  Administered 2019-06-26: 2 ug/kg/h via INTRAVENOUS
  Administered 2019-06-26: 0.4 ug/kg/h via INTRAVENOUS
  Administered 2019-06-27 (×2): 1 ug/kg/h via INTRAVENOUS
  Administered 2019-06-27 (×2): 2 ug/kg/h via INTRAVENOUS
  Administered 2019-06-28: 1 ug/kg/h via INTRAVENOUS
  Administered 2019-06-28: 0.8 ug/kg/h via INTRAVENOUS
  Administered 2019-06-28: 22:00:00 1.2 ug/kg/h via INTRAVENOUS
  Administered 2019-06-28 – 2019-06-29 (×3): 1 ug/kg/h via INTRAVENOUS
  Administered 2019-06-29: 05:00:00 1.4 ug/kg/h via INTRAVENOUS
  Administered 2019-06-29: 09:00:00 1 ug/kg/h via INTRAVENOUS
  Administered 2019-06-29: 18:00:00 1.4 ug/kg/h via INTRAVENOUS
  Administered 2019-06-30 (×3): 2 ug/kg/h via INTRAVENOUS
  Administered 2019-06-30: 05:00:00 1.7 ug/kg/h via INTRAVENOUS
  Administered 2019-06-30 – 2019-07-01 (×3): 2 ug/kg/h via INTRAVENOUS
  Administered 2019-07-01: 09:00:00 1.7 ug/kg/h via INTRAVENOUS
  Administered 2019-07-01 (×7): 2 ug/kg/h via INTRAVENOUS
  Administered 2019-07-02: 11:00:00 1.9 ug/kg/h via INTRAVENOUS
  Administered 2019-07-02 (×5): 2 ug/kg/h via INTRAVENOUS
  Administered 2019-07-02: 14:00:00 1.7 ug/kg/h via INTRAVENOUS
  Administered 2019-07-03: 21:00:00 1.9 ug/kg/h via INTRAVENOUS
  Administered 2019-07-03 – 2019-07-04 (×3): 2 ug/kg/h via INTRAVENOUS
  Administered 2019-07-04: 05:00:00 1.5 ug/kg/h via INTRAVENOUS
  Administered 2019-07-04 – 2019-07-05 (×6): 1.8 ug/kg/h via INTRAVENOUS
  Administered 2019-07-05: 21:00:00 2 ug/kg/h via INTRAVENOUS
  Administered 2019-07-05: 18:00:00 1.9 ug/kg/h via INTRAVENOUS
  Administered 2019-07-05: 12:00:00 2 ug/kg/h via INTRAVENOUS
  Administered 2019-07-05: 09:00:00 1.9 ug/kg/h via INTRAVENOUS
  Administered 2019-07-06 (×2): 2 ug/kg/h via INTRAVENOUS
  Administered 2019-07-06: 21:00:00 1.8 ug/kg/h via INTRAVENOUS
  Administered 2019-07-06 (×2): 2 ug/kg/h via INTRAVENOUS
  Administered 2019-07-06 (×2): 1.8 ug/kg/h via INTRAVENOUS
  Administered 2019-07-06: 19:00:00 2 ug/kg/h via INTRAVENOUS
  Administered 2019-07-07: 02:00:00 0.6 ug/kg/h via INTRAVENOUS
  Filled 2019-06-26 (×17): qty 100
  Filled 2019-06-26: qty 200
  Filled 2019-06-26 (×8): qty 100
  Filled 2019-06-26: qty 200
  Filled 2019-06-26 (×4): qty 100
  Filled 2019-06-26: qty 400
  Filled 2019-06-26: qty 100
  Filled 2019-06-26: qty 200
  Filled 2019-06-26: qty 300
  Filled 2019-06-26: qty 100
  Filled 2019-06-26: qty 200
  Filled 2019-06-26 (×3): qty 100
  Filled 2019-06-26: qty 200
  Filled 2019-06-26 (×3): qty 100
  Filled 2019-06-26: qty 200
  Filled 2019-06-26: qty 100
  Filled 2019-06-26 (×2): qty 200
  Filled 2019-06-26 (×2): qty 100
  Filled 2019-06-26: qty 400
  Filled 2019-06-26 (×8): qty 100

## 2019-06-26 MED ORDER — SODIUM CHLORIDE 0.9 % IV SOLN
2.0000 g | Freq: Three times a day (TID) | INTRAVENOUS | Status: DC
  Administered 2019-06-26 – 2019-06-28 (×7): 2 g via INTRAVENOUS
  Filled 2019-06-26 (×9): qty 2

## 2019-06-26 MED ORDER — MIDAZOLAM 50MG/50ML (1MG/ML) PREMIX INFUSION
0.5000 mg/h | INTRAVENOUS | Status: DC
  Administered 2019-06-26: 0.5 mg/h via INTRAVENOUS
  Administered 2019-06-27: 5 mg/h via INTRAVENOUS
  Filled 2019-06-26 (×3): qty 50

## 2019-06-26 NOTE — Progress Notes (Addendum)
Subjective: Patient remains intubated Per nursing staff: patient will follow commands and move all 4 extremities  Objective: Vital signs in last 24 hours: Temp:  [98.1 F (36.7 C)-102.7 F (39.3 C)] 98.3 F (36.8 C) (11/08 0336) Pulse Rate:  [95-175] 99 (11/08 0800) Resp:  [16-22] 18 (11/08 0800) BP: (110-152)/(64-87) 130/71 (11/08 0800) SpO2:  [89 %-99 %] 96 % (11/08 0800) FiO2 (%):  [40 %-60 %] 40 % (11/08 0826)  Intake/Output from previous day: 11/07 0701 - 11/08 0700 In: 1685.9 [I.V.:725.9; NG/GT:960] Out: 1250 [Urine:1250] Intake/Output this shift: Total I/O In: 62.7 [I.V.:22.7; NG/GT:40] Out: -   Labs: Recent Labs    06/24/19 0705 06/25/19 0438 06/26/19 0603  HGB 10.5* 9.9* 9.4*   Recent Labs    06/25/19 0438 06/26/19 0603  WBC 7.8 8.5  RBC 3.12* 2.84*  HCT 30.0* 27.2*  PLT 219 210   Recent Labs    06/25/19 0438 06/26/19 0603  NA 133* 134*  K 3.9 3.7  CL 98 98  CO2 25 28  BUN 12 15  CREATININE 0.70 0.75  GLUCOSE 152* 166*  CALCIUM 8.1* 8.0*   No results for input(s): LABPT, INR in the last 72 hours.  Physical Exam: Exam  Left upper extremity maintained in sling.  Superficial abrasions about the left shoulder protected by Mepilex dressing.  No change from yesterday  Assessment/Plan:    Await extubation for more comprehensive secondary survey No new recommendations at this time  Brandon Flowers for Dr. Melina Schools [facility] [phone] 06/26/2019, 9:25 AM
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
## 2019-06-26 NOTE — Progress Notes (Signed)
Patient with new increase in chest tube output and intermittent desaturations requiring adjustments to vent. Trauma MD notified, CXR ordered. After review of imaging per MD will keep chest tube for now and return to -20 suction.   Candy Sledge, RN

## 2019-06-26 NOTE — Progress Notes (Signed)
Patient ID: Brandon Flowers, male   DOB: [birth year], 43 y.o.   MRN: [id number] BP (!) 142/77   Pulse (!) 108   Temp 99.2 F (37.3 C) (Axillary)   Resp 18   Ht 5\' 11"  (1.803 m)   Wt 70.9 kg   SpO2 94%   BMI 21.80 kg/m  Sedated , intubated Perrl, about 64mm Triggers breaths, +cough No new recommendations Will follow

## 2019-06-27 ENCOUNTER — Inpatient Hospital Stay (HOSPITAL_COMMUNITY): Payer: Self-pay

## 2019-06-27 ENCOUNTER — Inpatient Hospital Stay (HOSPITAL_COMMUNITY): Payer: Self-pay | Admitting: Anesthesiology

## 2019-06-27 LAB — CBC
HCT: 27.8 % — ABNORMAL LOW (ref 39.0–52.0)
Hemoglobin: 9 g/dL — ABNORMAL LOW (ref 13.0–17.0)
MCH: 31.3 pg (ref 26.0–34.0)
MCHC: 32.4 g/dL (ref 30.0–36.0)
MCV: 96.5 fL (ref 80.0–100.0)
Platelets: 259 10*3/uL (ref 150–400)
RBC: 2.88 MIL/uL — ABNORMAL LOW (ref 4.22–5.81)
RDW: 13.8 % (ref 11.5–15.5)
WBC: 9 10*3/uL (ref 4.0–10.5)
nRBC: 0 % (ref 0.0–0.2)

## 2019-06-27 LAB — BASIC METABOLIC PANEL
Anion gap: 9 (ref 5–15)
BUN: 19 mg/dL (ref 6–20)
CO2: 30 mmol/L (ref 22–32)
Calcium: 8.3 mg/dL — ABNORMAL LOW (ref 8.9–10.3)
Chloride: 101 mmol/L (ref 98–111)
Creatinine, Ser: 0.88 mg/dL (ref 0.61–1.24)
GFR calc Af Amer: >60 mL/min (ref >60)
GFR calc non Af Amer: >60 mL/min (ref >60)
Glucose, Bld: 132 mg/dL — ABNORMAL HIGH (ref 70–99)
Potassium: 3.9 mmol/L (ref 3.5–5.1)
Sodium: 140 mmol/L (ref 135–145)

## 2019-06-27 LAB — TRIGLYCERIDES: Triglycerides: 145 mg/dL (ref <150)

## 2019-06-27 LAB — GLUCOSE, CAPILLARY
Glucose-Capillary: 104 mg/dL — ABNORMAL HIGH (ref 70–99)
Glucose-Capillary: 109 mg/dL — ABNORMAL HIGH (ref 70–99)
Glucose-Capillary: 131 mg/dL — ABNORMAL HIGH (ref 70–99)
Glucose-Capillary: 131 mg/dL — ABNORMAL HIGH (ref 70–99)
Glucose-Capillary: 157 mg/dL — ABNORMAL HIGH (ref 70–99)
Glucose-Capillary: 94 mg/dL (ref 70–99)

## 2019-06-27 MED ORDER — SUCCINYLCHOLINE CHLORIDE 20 MG/ML IJ SOLN
INTRAMUSCULAR | Status: DC | PRN
  Administered 2019-06-27: 120 mg via INTRAVENOUS

## 2019-06-27 MED ORDER — ALBUTEROL SULFATE (2.5 MG/3ML) 0.083% IN NEBU
INHALATION_SOLUTION | RESPIRATORY_TRACT | Status: AC
  Filled 2019-06-27: qty 3

## 2019-06-27 MED ORDER — IPRATROPIUM-ALBUTEROL 0.5-2.5 (3) MG/3ML IN SOLN
3.0000 mL | Freq: Once | RESPIRATORY_TRACT | Status: AC
  Administered 2019-06-27: 17:00:00 3 mL via RESPIRATORY_TRACT
  Filled 2019-06-27: qty 3

## 2019-06-27 MED ORDER — HALOPERIDOL LACTATE 5 MG/ML IJ SOLN
INTRAMUSCULAR | Status: AC
  Administered 2019-06-27: 2.5 mg via INTRAVENOUS
  Filled 2019-06-27: qty 1

## 2019-06-27 MED ORDER — FENTANYL BOLUS VIA INFUSION
50.0000 ug | INTRAVENOUS | Status: DC | PRN
  Administered 2019-06-29 (×5): 50 ug via INTRAVENOUS
  Filled 2019-06-27: qty 50

## 2019-06-27 MED ORDER — FENTANYL 2500MCG IN NS 250ML (10MCG/ML) PREMIX INFUSION
50.0000 ug/h | INTRAVENOUS | Status: DC
  Administered 2019-06-27: 100 ug/h via INTRAVENOUS
  Administered 2019-06-28 (×2): 150 ug/h via INTRAVENOUS
  Administered 2019-06-29 – 2019-06-30 (×3): 200 ug/h via INTRAVENOUS
  Administered 2019-07-01: 50 ug/h via INTRAVENOUS
  Filled 2019-06-27 (×7): qty 250

## 2019-06-27 MED ORDER — ENOXAPARIN SODIUM 30 MG/0.3ML ~~LOC~~ SOLN
30.0000 mg | Freq: Two times a day (BID) | SUBCUTANEOUS | Status: DC
  Administered 2019-06-27 – 2019-07-07 (×21): 30 mg via SUBCUTANEOUS
  Filled 2019-06-27 (×21): qty 0.3

## 2019-06-27 MED ORDER — HALOPERIDOL LACTATE 5 MG/ML IJ SOLN
5.0000 mg | Freq: Once | INTRAMUSCULAR | Status: AC
  Administered 2019-06-27: 12:00:00 5 mg via INTRAVENOUS

## 2019-06-27 MED ORDER — METHOCARBAMOL 500 MG PO TABS
1000.0000 mg | ORAL_TABLET | Freq: Three times a day (TID) | ORAL | Status: DC
  Administered 2019-06-27 (×2): 1000 mg via ORAL
  Filled 2019-06-27 (×3): qty 2

## 2019-06-27 MED ORDER — FENTANYL CITRATE (PF) 100 MCG/2ML IJ SOLN
50.0000 ug | Freq: Once | INTRAMUSCULAR | Status: AC
  Administered 2019-06-27: 50 ug via INTRAVENOUS

## 2019-06-27 MED ORDER — ACETAMINOPHEN 160 MG/5ML PO SOLN
1000.0000 mg | Freq: Four times a day (QID) | ORAL | Status: DC
  Administered 2019-06-27 – 2019-06-30 (×10): 1000 mg
  Filled 2019-06-27 (×11): qty 40.6

## 2019-06-27 MED ORDER — PROPOFOL 10 MG/ML IV BOLUS
INTRAVENOUS | Status: DC | PRN
  Administered 2019-06-27: 90 mg via INTRAVENOUS

## 2019-06-27 MED ORDER — RACEPINEPHRINE HCL 2.25 % IN NEBU: 0.5000 mL | INHALATION_SOLUTION | Freq: Once | RESPIRATORY_TRACT | Status: DC

## 2019-06-27 MED ORDER — RACEPINEPHRINE HCL 2.25 % IN NEBU
INHALATION_SOLUTION | RESPIRATORY_TRACT | Status: AC
  Filled 2019-06-27: qty 0.5

## 2019-06-27 MED ORDER — QUETIAPINE FUMARATE 100 MG PO TABS
100.0000 mg | ORAL_TABLET | Freq: Four times a day (QID) | ORAL | Status: DC
  Administered 2019-06-27 – 2019-06-28 (×2): 100 mg via ORAL
  Filled 2019-06-27 (×3): qty 1

## 2019-06-27 MED ORDER — HALOPERIDOL LACTATE 5 MG/ML IJ SOLN
INTRAMUSCULAR | Status: AC
  Administered 2019-06-27: 5 mg via INTRAVENOUS
  Filled 2019-06-27: qty 1

## 2019-06-27 MED ORDER — HALOPERIDOL LACTATE 5 MG/ML IJ SOLN
2.5000 mg | Freq: Once | INTRAMUSCULAR | Status: AC | PRN
  Administered 2019-06-27 (×2): 2.5 mg via INTRAVENOUS

## 2019-06-27 NOTE — Progress Notes (Signed)
bjective: Pt is still intubated, sedated.  Objective: Vital signs in last 24 hours: Temp:  [98.6 F (37 C)-100.9 F (38.3 C)] 99.6 F (37.6 C) (11/09 0800) Pulse Rate:  [77-124] 108 (11/09 1101) Resp:  [15-27] 27 (11/09 1101) BP: (105-142)/(58-82) 112/69 (11/09 0900) SpO2:  [86 %-99 %] 91 % (11/09 1101) FiO2 (%):  [40 %-70 %] 40 % (11/09 1101)  Physical Exam Constitutional:Intubated and sedated.He appearswell-developedand well-nourished.  Head:Multiple lacerationsand abrasions. Staples in place. Right Ear: External earnormal.  Left Ear: Dry blood noted. TM could not be visualized. Nose:Normalmucosa. Mouth/Throat:Oral cavity and oropharynxareclear and moist. ET tube in place. Neck:No tracheal deviationpresent.No open wound. Cardiovascular:Normal rate,regular rhythm. Respiratory:Intubated. On vent. Neurological: Sedated. Not responsive. Skin: Skin iswarmand dry. He isnot diaphoretic.  Recent Labs    06/26/19 0603 06/27/19 0423  WBC 8.5 9.0  HGB 9.4* 9.0*  HCT 27.2* 27.8*  PLT 210 259   Recent Labs    06/26/19 0603 06/27/19 0423  NA 134* 140  K 3.7 3.9  CL 98 101  CO2 28 30  GLUCOSE 166* 132*  BUN 15 19  CREATININE 0.75 0.88  CALCIUM 8.0* 8.3*    Medications:  I have reviewed the patient's current medications. Scheduled: . acetaminophen (TYLENOL) oral liquid 160 mg/5 mL  1,000 mg Per Tube Q6H  . bacitracin   Topical BID  . chlorhexidine gluconate (MEDLINE KIT)  15 mL Mouth Rinse BID  . Chlorhexidine Gluconate Cloth  6 each Topical Daily  . clonazePAM  1 mg Per Tube BID  . enoxaparin (LOVENOX) injection  30 mg bcutaneous Q12H  . feeding supplement (PRO-STAT SUGAR FREE 64)  30 mL Per Tube Daily  . mouth rinse  15 mL Mouth Rinse 10 times per day  . methocarbamol  1,000 mg Oral Q8H  . pantoprazole sodium  40 mg Per Tube Daily  . QUEtiapine  100 mg Oral Q6H  . selenium  200 mcg Per Tube Daily  . vitamin C  1,000 mg Per Tube Q8H    Continuous: . sodium chloride 10 mL/hr at 06/27/19 0800  . ceFEPime (MAXIPIME) IV Stopped (06/27/19 0537)  . dexmedetomidine (PRECEDEX) IV infusion 1 mcg/kg/hr (06/27/19 0800)  . feeding supplement (PIVOT 1.5 CAL) 1,000 mL (06/27/19 0205)    Assessment/Plan: Transverse left temporal bone fracture. No obvious otic capsule involvement.  - No new recommendation. - Will evaluate his hearing and facial nerve function when he is extubated and awake.   LOS: 5 days    W  06/27/2019, 11:08 AM 

## 2019-06-27 NOTE — Progress Notes (Signed)
Subjective:     Patient resting comfortably in sling.  Intubated.  Objective:   VITALS:  Temp:  [98.6 F (37 C)-100.9 F (38.3 C)] 98.6 F (37 C) (11/09 0400) Pulse Rate:  [77-124] 78 (11/09 0722) Resp:  [15-22] 18 (11/09 0722) BP: (105-148)/(58-82) 112/78 (11/09 0722) SpO2:  [86 %-99 %] 95 % (11/09 0722) FiO2 (%):  [40 %-70 %] 40 % (11/09 0722)  General: WDWN patient in NAD. Skin:  Abrasions on face and hands Extremities: Sling to L UE warm/dry, no visible edema, erythema or echymosis.  No lymphadenopathy. Pulses: Radial 2+     LABS Recent Labs    06/25/19 0438 06/26/19 0603 06/27/19 0423  HGB 9.9* 9.4* 9.0*  WBC 7.8 8.5 9.0  PLT 219 210 259   Recent Labs    06/26/19 0603 06/27/19 0423  NA 134* 140  K 3.7 3.9  CL 98 101  CO2 28 30  BUN 15 19  CREATININE 0.75 0.88  GLUCOSE 166* 132*   No results for input(s): LABPT, INR in the last 72 hours.   Assessment/Plan:     L clavicle fx  Sling to L UE to be maintained  Plan to repeat radiographs and secondary survey to evaluate L clavicle fx once patient is extubated and able to cooperate.  Mechele Claude PA-C [facility] Office:  [phone]
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
## 2019-06-27 NOTE — Progress Notes (Signed)
Called by RN, pt self extubated. On arrival to room MD & RN present. Pt being assisted with ventilation with BVM. Pt is not alert. Stridor noted. Pt then intubated at bedside by CRNA. +Etco2 color change, =BBS. Pending cxr.

## 2019-06-27 NOTE — Plan of Care (Signed)
Pt self-extubated and required immediate reintubation, unable to protect airway or clear secretions.

## 2019-06-27 NOTE — Progress Notes (Signed)
Pt had a desaturation to 80%, pt was flipped back over to full vent support with previously set settings. RT will continue to monitor.

## 2019-06-27 NOTE — Progress Notes (Signed)
Pt found on PSV/CPAP at 1101. Pt tolerating 5/5 well at this time. RT will continue to monitor.

## 2019-06-27 NOTE — Progress Notes (Signed)
Unfortunately, patient self-extubated around 1530. Placed on NRB @ 15L O2 and NT suctioned. SpO2 remaining in mid-80s. Pt vomited. Dr. Bobbye Morton, anesthesia, and RT at bedside. Pt reintubated by anesthesia.

## 2019-06-27 NOTE — Progress Notes (Signed)
Self-extubated. Oxygenating with supplemental O2, but sudden episode of bilious emesis. Re-intubated for airway protection. F/u CXR ordered.   Jesusita Oka, MD General and [facility]

## 2019-06-27 NOTE — Anesthesia Procedure Notes (Signed)
Procedure Name: Intubation Date/Time: 06/27/2019 3:46 PM Performed by: Renato Shin, CRNA Pre-anesthesia Checklist: Patient identified, Emergency Drugs available, Suction available and Patient being monitored Patient Re-evaluated:Patient Re-evaluated prior to induction Oxygen Delivery Method: Circle system utilized Preoxygenation: Pre-oxygenation with 100% oxygen Induction Type: IV induction, Rapid sequence and Cricoid Pressure applied Laryngoscope Size: 2 and Glidescope Grade View: Grade I Tube type: Oral Tube size: 7.5 mm Number of attempts: 1 Airway Equipment and Method: Oral airway,  Video-laryngoscopy and Rigid stylet Placement Confirmation: ETT inserted through vocal cords under direct vision,  positive ETCO2 and breath sounds checked- equal and bilateral Secured at: 23 cm Tube secured with: Tape Dental Injury: Teeth and Oropharynx as per pre-operative assessment
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
## 2019-06-28 ENCOUNTER — Inpatient Hospital Stay (HOSPITAL_COMMUNITY): Payer: Self-pay

## 2019-06-28 LAB — CBC
HCT: 27.2 % — ABNORMAL LOW (ref 39.0–52.0)
Hemoglobin: 8.9 g/dL — ABNORMAL LOW (ref 13.0–17.0)
MCH: 31.9 pg (ref 26.0–34.0)
MCHC: 32.7 g/dL (ref 30.0–36.0)
MCV: 97.5 fL (ref 80.0–100.0)
Platelets: 282 10*3/uL (ref 150–400)
RBC: 2.79 MIL/uL — ABNORMAL LOW (ref 4.22–5.81)
RDW: 14.2 % (ref 11.5–15.5)
WBC: 10.1 10*3/uL (ref 4.0–10.5)
nRBC: 0 % (ref 0.0–0.2)

## 2019-06-28 LAB — BASIC METABOLIC PANEL
Anion gap: 7 (ref 5–15)
BUN: 22 mg/dL — ABNORMAL HIGH (ref 6–20)
CO2: 27 mmol/L (ref 22–32)
Calcium: 8.5 mg/dL — ABNORMAL LOW (ref 8.9–10.3)
Chloride: 106 mmol/L (ref 98–111)
Creatinine, Ser: 0.9 mg/dL (ref 0.61–1.24)
GFR calc Af Amer: >60 mL/min (ref >60)
GFR calc non Af Amer: >60 mL/min (ref >60)
Glucose, Bld: 109 mg/dL — ABNORMAL HIGH (ref 70–99)
Potassium: 3.9 mmol/L (ref 3.5–5.1)
Sodium: 140 mmol/L (ref 135–145)

## 2019-06-28 LAB — GLUCOSE, CAPILLARY
Glucose-Capillary: 106 mg/dL — ABNORMAL HIGH (ref 70–99)
Glucose-Capillary: 109 mg/dL — ABNORMAL HIGH (ref 70–99)
Glucose-Capillary: 109 mg/dL — ABNORMAL HIGH (ref 70–99)
Glucose-Capillary: 111 mg/dL — ABNORMAL HIGH (ref 70–99)
Glucose-Capillary: 112 mg/dL — ABNORMAL HIGH (ref 70–99)
Glucose-Capillary: 98 mg/dL (ref 70–99)

## 2019-06-28 LAB — MAGNESIUM: Magnesium: 2.2 mg/dL (ref 1.7–2.4)

## 2019-06-28 LAB — PHOSPHORUS: Phosphorus: 2.8 mg/dL (ref 2.5–4.6)

## 2019-06-28 MED ORDER — BETHANECHOL CHLORIDE 10 MG PO TABS
5.0000 mg | ORAL_TABLET | Freq: Three times a day (TID) | ORAL | Status: DC
  Administered 2019-06-28 – 2019-07-04 (×18): 5 mg
  Filled 2019-06-28 (×18): qty 1

## 2019-06-28 MED ORDER — QUETIAPINE FUMARATE 100 MG PO TABS
100.0000 mg | ORAL_TABLET | Freq: Four times a day (QID) | ORAL | Status: DC
  Administered 2019-06-28 – 2019-06-29 (×4): 100 mg
  Filled 2019-06-28 (×3): qty 1

## 2019-06-28 MED ORDER — CHLORHEXIDINE GLUCONATE 0.12 % MT SOLN
OROMUCOSAL | Status: AC
  Filled 2019-06-28: qty 15

## 2019-06-28 MED ORDER — METHOCARBAMOL 500 MG PO TABS
1000.0000 mg | ORAL_TABLET | Freq: Three times a day (TID) | ORAL | Status: DC
  Administered 2019-06-28 – 2019-07-01 (×7): 1000 mg
  Filled 2019-06-28 (×6): qty 2

## 2019-06-28 MED ORDER — SODIUM CHLORIDE 0.9 % IV SOLN
2.0000 g | Freq: Four times a day (QID) | INTRAVENOUS | Status: DC
  Administered 2019-06-28 – 2019-06-29 (×4): 2 g via INTRAVENOUS
  Filled 2019-06-28 (×6): qty 2000
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
## 2019-06-28 NOTE — Progress Notes (Signed)
Orthopedic Tech Progress Note Patient Details:  Brandon Flowers Apr 27, 1976 [id number]  Ortho Devices Type of Ortho Device: Arm sling Ortho Device/Splint Location: left Ortho Device/Splint Interventions: Application   Post Interventions Patient Tolerated: Well   [place] Pink 06/28/2019, 10:44 AM

## 2019-06-29 LAB — GLUCOSE, CAPILLARY
Glucose-Capillary: 105 mg/dL — ABNORMAL HIGH (ref 70–99)
Glucose-Capillary: 117 mg/dL — ABNORMAL HIGH (ref 70–99)
Glucose-Capillary: 130 mg/dL — ABNORMAL HIGH (ref 70–99)
Glucose-Capillary: 136 mg/dL — ABNORMAL HIGH (ref 70–99)

## 2019-06-29 LAB — BASIC METABOLIC PANEL
Anion gap: 8 (ref 5–15)
BUN: 23 mg/dL — ABNORMAL HIGH (ref 6–20)
CO2: 26 mmol/L (ref 22–32)
Calcium: 8 mg/dL — ABNORMAL LOW (ref 8.9–10.3)
Chloride: 106 mmol/L (ref 98–111)
Creatinine, Ser: 0.84 mg/dL (ref 0.61–1.24)
GFR calc Af Amer: >60 mL/min (ref >60)
GFR calc non Af Amer: >60 mL/min (ref >60)
Glucose, Bld: 139 mg/dL — ABNORMAL HIGH (ref 70–99)
Potassium: 3.6 mmol/L (ref 3.5–5.1)
Sodium: 140 mmol/L (ref 135–145)

## 2019-06-29 LAB — CBC
HCT: 27.5 % — ABNORMAL LOW (ref 39.0–52.0)
Hemoglobin: 8.6 g/dL — ABNORMAL LOW (ref 13.0–17.0)
MCH: 31.2 pg (ref 26.0–34.0)
MCHC: 31.3 g/dL (ref 30.0–36.0)
MCV: 99.6 fL (ref 80.0–100.0)
Platelets: 314 10*3/uL (ref 150–400)
RBC: 2.76 MIL/uL — ABNORMAL LOW (ref 4.22–5.81)
RDW: 14.6 % (ref 11.5–15.5)
WBC: 8.4 10*3/uL (ref 4.0–10.5)
nRBC: 0.4 % — ABNORMAL HIGH (ref 0.0–0.2)

## 2019-06-29 LAB — MAGNESIUM: Magnesium: 1.9 mg/dL (ref 1.7–2.4)

## 2019-06-29 LAB — PHOSPHORUS: Phosphorus: 3.3 mg/dL (ref 2.5–4.6)

## 2019-06-29 MED ORDER — MIDAZOLAM HCL 2 MG/2ML IJ SOLN
4.0000 mg | Freq: Once | INTRAMUSCULAR | Status: AC
  Administered 2019-06-30: 4 mg via INTRAVENOUS
  Filled 2019-06-29: qty 4

## 2019-06-29 MED ORDER — SODIUM CHLORIDE 0.9 % IV SOLN
2.0000 g | INTRAVENOUS | Status: AC
  Administered 2019-06-29 – 2019-07-03 (×5): 2 g via INTRAVENOUS
  Filled 2019-06-29 (×5): qty 2

## 2019-06-29 MED ORDER — PIVOT 1.5 CAL PO LIQD
1000.0000 mL | ORAL | Status: DC
  Administered 2019-06-29: 1000 mL

## 2019-06-29 MED ORDER — MAGNESIUM SULFATE 2 GM/50ML IV SOLN
2.0000 g | Freq: Once | INTRAVENOUS | Status: AC
  Administered 2019-06-29: 2 g via INTRAVENOUS
  Filled 2019-06-29: qty 50

## 2019-06-29 MED ORDER — QUETIAPINE FUMARATE 25 MG PO TABS
150.0000 mg | ORAL_TABLET | Freq: Four times a day (QID) | ORAL | Status: DC
  Administered 2019-06-29 – 2019-06-30 (×4): 150 mg
  Filled 2019-06-29 (×5): qty 2

## 2019-06-29 NOTE — Progress Notes (Signed)
Subjective: Pt self extubated last night. Didn't do well. Re-intubated.  Objective: Vital signs in last 24 hours: Temp:  [97.4 F (36.3 C)-98.7 F (37.1 C)] 98.7 F (37.1 C) (11/11 0400) Pulse Rate:  [68-96] 72 (11/11 0500) Resp:  [18-20] 18 (11/11 0500) BP: (93-138)/(53-72) 97/58 (11/11 0500) SpO2:  [90 %-97 %] 93 % (11/11 0500) FiO2 (%):  [40 %-70 %] 70 % (11/11 0451)  Physical Exam Constitutional:Intubated and sedated.He appearswell-developedand well-nourished.  Head:Multiple lacerationsand abrasions. Staples in place. Right Ear: External earnormal.  Left Ear: Dry blood noted. TM could not be visualized. Nose:Normalmucosa. Mouth/Throat:Oral cavity and oropharynxareclear and moist. ET tube in place. Neck:No tracheal deviationpresent.No open wound. Cardiovascular:Normal rate,regular rhythm. Respiratory:Intubated. On vent. Neurological: Sedated. Not responsive. Skin: Skin iswarmand dry. He isnot diaphoretic.  Recent Labs    06/28/19 0540 06/29/19 0455  WBC 10.1 8.4  HGB 8.9* 8.6*  HCT 27.2* 27.5*  PLT 282 314   Recent Labs    06/28/19 0540 06/29/19 0455  NA 140 140  K 3.9 3.6  CL 106 106  CO2 27 26  GLUCOSE 109* 139*  BUN 22* 23*  CREATININE 0.90 0.84  CALCIUM 8.5* 8.0*    Medications:  I have reviewed the patient's current medications. Scheduled: . acetaminophen (TYLENOL) oral liquid 160 mg/5 mL  1,000 mg Per Tube Q6H  . bacitracin   Topical BID  . bethanechol  5 mg Per Tube TID  . chlorhexidine gluconate (MEDLINE KIT)  15 mL Mouth Rinse BID  . Chlorhexidine Gluconate Cloth  6 each Topical Daily  . clonazePAM  1 mg Per Tube BID  . enoxaparin (LOVENOX) injection  30 mg Subcutaneous Q12H  . feeding supplement (PRO-STAT SUGAR FREE 64)  30 mL Per Tube Daily  . mouth rinse  15 mL Mouth Rinse 10 times per day  . methocarbamol  1,000 mg Per Tube Q8H  . pantoprazole sodium  40 mg Per Tube Daily  . QUEtiapine  100 mg Per Tube Q6H  .  Racepinephrine HCl  0.5 mL Nebulization Once  . selenium  200 mcg Per Tube Daily  . vitamin C  1,000 mg Per Tube Q8H   Continuous: . sodium chloride 10 mL/hr at 06/29/19 0100  . dexmedetomidine (PRECEDEX) IV infusion 1.4 mcg/kg/hr (06/29/19 0432)  . fentaNYL infusion INTRAVENOUS 175 mcg/hr (06/29/19 0100)  . nafcillin IV 2 g (06/29/19 0555)    Assessment/Plan: Transverse left temporal bone fracture. No otic capsule involvement.  - No new recommendation. - Will evaluate his hearing and facial nerve function when he is extubated and awake.    LOS: 7 days   Anaria Kroner W Bryker Fletchall 06/29/2019, 7:20 AM
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
## 2019-06-29 NOTE — Social Work (Addendum)
4:10pm- per Dr. Bobbye Morton they were able to speak with pt mother at hospital today, consult cleared.   4:08pm- Acknowledging consult for assistance with finding contact for pt mother. On pt chart merge the following numbers are also listed for pt mother Maveryck Bahri  [phone]  [phone]   Pt brother Wray Goehring # is [phone]. CSW has also secure messaged these to attending trauma MD and RNCM.   Westley Hummer, MSW, [facility] [employer] [phone]

## 2019-06-29 NOTE — Progress Notes (Signed)
Subjective: Still intubated. Unable to extubate due to agitation.  Objective: Vital signs in last 24 hours: Temp:  [97.9 F (36.6 C)-98.7 F (37.1 C)] 98.4 F (36.9 C) (11/11 0800) Pulse Rate:  [60-96] 69 (11/11 1100) Resp:  [18-23] 18 (11/11 1100) BP: (93-138)/(53-80) 93/55 (11/11 1100) SpO2:  [90 %-99 %] 95 % (11/11 1100) FiO2 (%):  [40 %-70 %] 60 % (11/11 0750)  Physical Exam Constitutional:Intubated and sedated.He appearswell-developedand well-nourished.  Head:Multiple lacerationsand abrasions.  Right Ear: External earnormal.  Left Ear: Dry blood noted. TM could not be visualized. Nose:Normalmucosa. Mouth/Throat:Oral cavity and oropharynxareclear and moist. ET tube in place. Neck:No tracheal deviationpresent.No open wound. Cardiovascular:Normal rate,regular rhythm. Respiratory:Intubated. On vent. Neurological: Sedated. Not responsive. Skin: Skin iswarmand dry. He isnot diaphoretic.  Recent Labs    06/28/19 0540 06/29/19 0455  WBC 10.1 8.4  HGB 8.9* 8.6*  HCT 27.2* 27.5*  PLT 282 314   Recent Labs    06/28/19 0540 06/29/19 0455  NA 140 140  K 3.9 3.6  CL 106 106  CO2 27 26  GLUCOSE 109* 139*  BUN 22* 23*  CREATININE 0.90 0.84  CALCIUM 8.5* 8.0*    Medications:  I have reviewed the patient's current medications. Scheduled: . acetaminophen (TYLENOL) oral liquid 160 mg/5 mL  1,000 mg Per Tube Q6H  . bacitracin   Topical BID  . bethanechol  5 mg Per Tube TID  . chlorhexidine gluconate (MEDLINE KIT)  15 mL Mouth Rinse BID  . Chlorhexidine Gluconate Cloth  6 each Topical Daily  . clonazePAM  1 mg Per Tube BID  . enoxaparin (LOVENOX) injection  30 mg Subcutaneous Q12H  . feeding supplement (PRO-STAT SUGAR FREE 64)  30 mL Per Tube Daily  . mouth rinse  15 mL Mouth Rinse 10 times per day  . methocarbamol  1,000 mg Per Tube Q8H  . pantoprazole sodium  40 mg Per Tube Daily  . QUEtiapine  150 mg Per Tube Q6H  . Racepinephrine HCl  0.5  mL Nebulization Once  . selenium  200 mcg Per Tube Daily  . vitamin C  1,000 mg Per Tube Q8H   Continuous: . sodium chloride Stopped (06/29/19 0939)  . cefTRIAXone (ROCEPHIN)  IV Stopped (06/29/19 1009)  . dexmedetomidine (PRECEDEX) IV infusion 1 mcg/kg/hr (06/29/19 1100)  . feeding supplement (PIVOT 1.5 CAL)    . fentaNYL infusion INTRAVENOUS 200 mcg/hr (06/29/19 1100)  . magnesium sulfate bolus IVPB 50 mL/hr at 06/29/19 1100   BTY:OMAYOKHTX, docusate, fentaNYL, metoprolol tartrate, ondansetron **OR** ondansetron (ZOFRAN) IV, sodium chloride flush  Assessment/Plan: Transverse left temporal bone fracture. No otic capsule involvement. - Please call when patient is extubated. - Will re-evaluate when patient is awake.    LOS: 7 days   Brandon Flowers Brandon Flowers 06/29/2019, 11:14 AM

## 2019-06-30 ENCOUNTER — Encounter (HOSPITAL_COMMUNITY): Payer: Self-pay | Admitting: Certified Registered"

## 2019-06-30 ENCOUNTER — Encounter (HOSPITAL_COMMUNITY): Admission: EM | Discharge status: Against Medical Advice | Payer: Self-pay | Source: Home / Self Care

## 2019-06-30 ENCOUNTER — Inpatient Hospital Stay (HOSPITAL_COMMUNITY): Payer: Self-pay

## 2019-06-30 LAB — CBC
HCT: 27.8 % — ABNORMAL LOW (ref 39.0–52.0)
Hemoglobin: 8.6 g/dL — ABNORMAL LOW (ref 13.0–17.0)
MCH: 31 pg (ref 26.0–34.0)
MCHC: 30.9 g/dL (ref 30.0–36.0)
MCV: 100.4 fL — ABNORMAL HIGH (ref 80.0–100.0)
Platelets: 362 10*3/uL (ref 150–400)
RBC: 2.77 MIL/uL — ABNORMAL LOW (ref 4.22–5.81)
RDW: 14.9 % (ref 11.5–15.5)
WBC: 12.7 10*3/uL — ABNORMAL HIGH (ref 4.0–10.5)
nRBC: 0.4 % — ABNORMAL HIGH (ref 0.0–0.2)

## 2019-06-30 LAB — BASIC METABOLIC PANEL
Anion gap: 10 (ref 5–15)
BUN: 20 mg/dL (ref 6–20)
CO2: 27 mmol/L (ref 22–32)
Calcium: 7.8 mg/dL — ABNORMAL LOW (ref 8.9–10.3)
Chloride: 105 mmol/L (ref 98–111)
Creatinine, Ser: 0.76 mg/dL (ref 0.61–1.24)
GFR calc Af Amer: >60 mL/min (ref >60)
GFR calc non Af Amer: >60 mL/min (ref >60)
Glucose, Bld: 145 mg/dL — ABNORMAL HIGH (ref 70–99)
Potassium: 3.8 mmol/L (ref 3.5–5.1)
Sodium: 142 mmol/L (ref 135–145)

## 2019-06-30 LAB — GLUCOSE, CAPILLARY
Glucose-Capillary: 94 mg/dL (ref 70–99)
Glucose-Capillary: 163 mg/dL — ABNORMAL HIGH (ref 70–99)
Glucose-Capillary: 104 mg/dL — ABNORMAL HIGH (ref 70–99)
Glucose-Capillary: 74 mg/dL (ref 70–99)
Glucose-Capillary: 108 mg/dL — ABNORMAL HIGH (ref 70–99)
Glucose-Capillary: 102 mg/dL — ABNORMAL HIGH (ref 70–99)

## 2019-06-30 LAB — SUSCEPTIBILITY, AER + ANAEROB

## 2019-06-30 LAB — SUSCEPTIBILITY RESULT

## 2019-06-30 LAB — PHOSPHORUS: Phosphorus: 3.4 mg/dL (ref 2.5–4.6)

## 2019-06-30 LAB — MAGNESIUM: Magnesium: 2 mg/dL (ref 1.7–2.4)

## 2019-06-30 SURGERY — CREATION, TRACHEOSTOMY, PERCUTANEOUS
Anesthesia: Choice

## 2019-06-30 MED ORDER — ORAL CARE MOUTH RINSE
15.0000 mL | Freq: Two times a day (BID) | OROMUCOSAL | Status: DC
  Administered 2019-06-30 – 2019-07-06 (×13): 15 mL via OROMUCOSAL

## 2019-06-30 MED ORDER — CHLORHEXIDINE GLUCONATE 0.12 % MT SOLN
15.0000 mL | Freq: Two times a day (BID) | OROMUCOSAL | Status: DC
  Administered 2019-07-01 – 2019-07-06 (×7): 15 mL via OROMUCOSAL
  Filled 2019-06-30 (×6): qty 15

## 2019-06-30 MED ORDER — QUETIAPINE FUMARATE 25 MG PO TABS
150.0000 mg | ORAL_TABLET | Freq: Four times a day (QID) | ORAL | Status: DC
  Administered 2019-06-30 – 2019-07-04 (×12): 150 mg via ORAL
  Filled 2019-06-30 (×13): qty 2

## 2019-06-30 MED ORDER — ACETAMINOPHEN 500 MG PO TABS
1000.0000 mg | ORAL_TABLET | Freq: Four times a day (QID) | ORAL | Status: DC | PRN
  Administered 2019-06-30: 18:00:00 1000 mg via ORAL
  Filled 2019-06-30: qty 2

## 2019-06-30 NOTE — Procedures (Signed)
Extubation Procedure Note  Patient Details:   Name: Brandon Flowers DOB: Apr 14, 1976 MRN: [id number]   Airway Documentation:  Airway 7.5 mm (Active)  Secured at (cm) 24 cm 06/30/19 1100  Measured From Lips 06/30/19 1100  Secured Location Left 06/30/19 1100  Secured By [employer] 06/30/19 1100  Tube Holder Repositioned Yes 06/30/19 1100  Cuff Pressure (cm H2O) 24 cm H2O 06/29/19 1938  Site Condition Cool;Dry 06/30/19 1100   Vent end date: (not recorded) Vent end time: (not recorded)   Evaluation  O2 sats: stable throughout Complications: No apparent complications Patient self extubated.  Bilateral Breath Sounds: Rhonchi   Patient self extubated and is currently on a non-rebreather tolerating well at this time. No stridor noted. MD is aware.   Yes  [name] 06/30/2019, 11:37 AM
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
## 2019-07-01 ENCOUNTER — Inpatient Hospital Stay (HOSPITAL_COMMUNITY): Payer: Self-pay

## 2019-07-01 DIAGNOSIS — S069X9A Unspecified intracranial injury with loss of consciousness of unspecified duration, initial encounter: Secondary | ICD-10-CM

## 2019-07-01 DIAGNOSIS — Z7409 Other reduced mobility: Secondary | ICD-10-CM

## 2019-07-01 DIAGNOSIS — Z789 Other specified health status: Secondary | ICD-10-CM

## 2019-07-01 LAB — CBC
HCT: 27.7 % — ABNORMAL LOW (ref 39.0–52.0)
Hemoglobin: 8.8 g/dL — ABNORMAL LOW (ref 13.0–17.0)
MCH: 31.4 pg (ref 26.0–34.0)
MCHC: 31.8 g/dL (ref 30.0–36.0)
MCV: 98.9 fL (ref 80.0–100.0)
Platelets: 371 10*3/uL (ref 150–400)
RBC: 2.8 MIL/uL — ABNORMAL LOW (ref 4.22–5.81)
RDW: 14.5 % (ref 11.5–15.5)
WBC: 12.4 10*3/uL — ABNORMAL HIGH (ref 4.0–10.5)
nRBC: 0.2 % (ref 0.0–0.2)

## 2019-07-01 LAB — BASIC METABOLIC PANEL
Anion gap: 11 (ref 5–15)
BUN: 18 mg/dL (ref 6–20)
CO2: 26 mmol/L (ref 22–32)
Calcium: 7.9 mg/dL — ABNORMAL LOW (ref 8.9–10.3)
Chloride: 104 mmol/L (ref 98–111)
Creatinine, Ser: 0.63 mg/dL (ref 0.61–1.24)
GFR calc Af Amer: >60 mL/min (ref >60)
GFR calc non Af Amer: >60 mL/min (ref >60)
Glucose, Bld: 104 mg/dL — ABNORMAL HIGH (ref 70–99)
Potassium: 3.7 mmol/L (ref 3.5–5.1)
Sodium: 141 mmol/L (ref 135–145)

## 2019-07-01 LAB — GLUCOSE, CAPILLARY: Glucose-Capillary: 92 mg/dL (ref 70–99)

## 2019-07-01 MED ORDER — BOOST / RESOURCE BREEZE PO LIQD CUSTOM
1.0000 | Freq: Three times a day (TID) | ORAL | Status: DC
  Administered 2019-07-01 – 2019-07-06 (×10): 1 via ORAL

## 2019-07-01 MED ORDER — IPRATROPIUM-ALBUTEROL 0.5-2.5 (3) MG/3ML IN SOLN
3.0000 mL | Freq: Four times a day (QID) | RESPIRATORY_TRACT | Status: DC
  Administered 2019-07-01 (×3): 3 mL via RESPIRATORY_TRACT
  Filled 2019-07-01 (×4): qty 3

## 2019-07-01 MED ORDER — FENTANYL CITRATE (PF) 100 MCG/2ML IJ SOLN
25.0000 ug | INTRAMUSCULAR | Status: DC | PRN
  Administered 2019-07-01 – 2019-07-04 (×11): 100 ug via INTRAVENOUS
  Filled 2019-07-01 (×12): qty 2

## 2019-07-01 MED ORDER — PANTOPRAZOLE SODIUM 40 MG PO TBEC
40.0000 mg | DELAYED_RELEASE_TABLET | Freq: Every day | ORAL | Status: DC
  Administered 2019-07-01 – 2019-07-04 (×4): 40 mg via ORAL
  Filled 2019-07-01 (×4): qty 1

## 2019-07-01 MED ORDER — METHOCARBAMOL 500 MG PO TABS
1000.0000 mg | ORAL_TABLET | Freq: Three times a day (TID) | ORAL | Status: DC
  Administered 2019-07-01 – 2019-07-07 (×14): 1000 mg via ORAL
  Filled 2019-07-01 (×17): qty 2

## 2019-07-01 MED ORDER — GLYCOPYRROLATE 1 MG PO TABS
1.0000 mg | ORAL_TABLET | Freq: Three times a day (TID) | ORAL | Status: DC
  Administered 2019-07-01 – 2019-07-07 (×18): 1 mg via ORAL
  Filled 2019-07-01 (×19): qty 1

## 2019-07-01 MED ORDER — TAMSULOSIN HCL 0.4 MG PO CAPS
0.4000 mg | ORAL_CAPSULE | Freq: Every day | ORAL | Status: DC
  Administered 2019-07-01 – 2019-07-07 (×7): 0.4 mg via ORAL
  Filled 2019-07-01 (×7): qty 1

## 2019-07-01 MED ORDER — OXYCODONE HCL 5 MG PO TABS
5.0000 mg | ORAL_TABLET | ORAL | Status: DC | PRN
  Administered 2019-07-03 – 2019-07-07 (×8): 10 mg via ORAL
  Filled 2019-07-01 (×9): qty 2

## 2019-07-01 MED ORDER — GUAIFENESIN ER 600 MG PO TB12
600.0000 mg | ORAL_TABLET | Freq: Three times a day (TID) | ORAL | Status: DC
  Administered 2019-07-01 – 2019-07-07 (×18): 600 mg via ORAL
  Filled 2019-07-01 (×19): qty 1

## 2019-07-01 MED ORDER — SODIUM CHLORIDE 0.9 % IN NEBU
3.0000 mL | INHALATION_SOLUTION | Freq: Four times a day (QID) | RESPIRATORY_TRACT | Status: DC
  Filled 2019-07-01 (×3): qty 3

## 2019-07-01 MED ORDER — ACETAMINOPHEN 500 MG PO TABS
1000.0000 mg | ORAL_TABLET | Freq: Four times a day (QID) | ORAL | Status: DC
  Administered 2019-07-01 – 2019-07-07 (×20): 1000 mg via ORAL
  Filled 2019-07-01 (×23): qty 2

## 2019-07-01 MED ORDER — DOCUSATE SODIUM 100 MG PO CAPS
100.0000 mg | ORAL_CAPSULE | Freq: Two times a day (BID) | ORAL | Status: DC
  Administered 2019-07-01 – 2019-07-07 (×8): 100 mg via ORAL
  Filled 2019-07-01 (×9): qty 1

## 2019-07-01 MED ORDER — CLONAZEPAM 1 MG PO TABS
1.0000 mg | ORAL_TABLET | Freq: Two times a day (BID) | ORAL | Status: DC
  Administered 2019-07-01 – 2019-07-07 (×12): 1 mg via ORAL
  Filled 2019-07-01 (×12): qty 1
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
## 2019-07-01 NOTE — Progress Notes (Signed)
RT attempted to give pt breathing treatment and pt refused. RT will continue to monitor.
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
## 2019-07-01 NOTE — Care Management (Signed)
Incompetency letter emailed to [employer] in [employer].    Reinaldo Raddle, RN, BSN  Trauma/Neuro ICU Case Manager [phone]
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
## 2019-07-01 NOTE — Progress Notes (Signed)
Rehab Admissions Coordinator Note:  Patient was screened by Cleatrice Burke for appropriateness for an Inpatient Acute Rehab Consult. Per OT recs. At this time, we are recommending Inpatient Rehab consult. I will contact Trauma for Consult order.  Cleatrice Burke RN MSN 07/01/2019, 11:37 AM  I can be reached at [phone].

## 2019-07-01 NOTE — Progress Notes (Signed)
Wasted 223 ml of fentanyl in the sink. Witnessed by Almedia Balls, RN.

## 2019-07-02 ENCOUNTER — Inpatient Hospital Stay (HOSPITAL_COMMUNITY): Payer: Self-pay

## 2019-07-02 ENCOUNTER — Other Ambulatory Visit: Payer: Self-pay

## 2019-07-02 DIAGNOSIS — S0219XA Other fracture of base of skull, initial encounter for closed fracture: Secondary | ICD-10-CM

## 2019-07-02 LAB — GLUCOSE, CAPILLARY
Glucose-Capillary: 95 mg/dL (ref 70–99)
Glucose-Capillary: 111 mg/dL — ABNORMAL HIGH (ref 70–99)
Glucose-Capillary: 98 mg/dL (ref 70–99)

## 2019-07-02 MED ORDER — FUROSEMIDE 10 MG/ML IJ SOLN
20.0000 mg | Freq: Once | INTRAMUSCULAR | Status: AC
  Administered 2019-07-02: 13:00:00 20 mg via INTRAVENOUS
  Filled 2019-07-02: qty 2

## 2019-07-02 MED ORDER — IPRATROPIUM-ALBUTEROL 0.5-2.5 (3) MG/3ML IN SOLN: 3.0000 mL | RESPIRATORY_TRACT | Status: DC | PRN

## 2019-07-02 MED ORDER — HALOPERIDOL LACTATE 5 MG/ML IJ SOLN
5.0000 mg | Freq: Four times a day (QID) | INTRAMUSCULAR | Status: DC | PRN
  Administered 2019-07-02 – 2019-07-06 (×10): 5 mg via INTRAVENOUS
  Filled 2019-07-02 (×11): qty 1

## 2019-07-02 MED ORDER — LORAZEPAM 2 MG/ML IJ SOLN
1.0000 mg | Freq: Once | INTRAMUSCULAR | Status: AC
  Administered 2019-07-02: 1 mg via INTRAVENOUS
  Filled 2019-07-02: qty 1
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
## 2019-07-02 NOTE — Progress Notes (Signed)
Patient repeatedly threatening staff, kicking in bed, and trying to break bed rails. Patient continue to take oxygen mask off despite being educated and him desatting to low 80s. RN will continue to monitor.

## 2019-07-02 NOTE — Progress Notes (Signed)
Subjective: Off vent. Still sedated. Confused.  Objective: Vital signs in last 24 hours: Temp:  [97.6 F (36.4 C)-99.1 F (37.3 C)] 97.6 F (36.4 C) (11/14 0800) Pulse Rate:  [52-103] 60 (11/14 0700) Resp:  [17-32] 20 (11/14 0700) BP: (117-138)/(67-85) 129/74 (11/14 0700) SpO2:  [87 %-100 %] 100 % (11/14 0700) FiO2 (%):  [40 %] 40 % (11/13 1600) Weight:  [75.5 kg] 75.5 kg (11/14 0500)  Physical Exam Constitutional:Sedated.He appearswell-developedand well-nourished.  Head:Multiple lacerationsand abrasions, healing well. Facial movement symmetric. Right Ear: External earnormal.  Left Ear: Dry blood noted. Otherwise normal. Nose:Normalmucosa. Mouth/Throat:Oral cavity and oropharynxareclear and moist.  Neck:No tracheal deviationpresent.No open wound. Cardiovascular:Normal rate,regular rhythm. Neurological: Sedated. Minimally responsive. Skin: Skin iswarmand dry. He isnot diaphoretic.  Recent Labs    06/30/19 0512 07/01/19 0504  WBC 12.7* 12.4*  HGB 8.6* 8.8*  HCT 27.8* 27.7*  PLT 362 371   Recent Labs    06/30/19 0512 07/01/19 0504  NA 142 141  K 3.8 3.7  CL 105 104  CO2 27 26  GLUCOSE 145* 104*  BUN 20 18  CREATININE 0.76 0.63  CALCIUM 7.8* 7.9*    Medications:  I have reviewed the patient's current medications. Scheduled: . acetaminophen  1,000 mg Oral Q6H  . bacitracin   Topical BID  . bethanechol  5 mg Per Tube TID  . chlorhexidine  15 mL Mouth Rinse BID  . chlorhexidine gluconate (MEDLINE KIT)  15 mL Mouth Rinse BID  . Chlorhexidine Gluconate Cloth  6 each Topical Daily  . clonazePAM  1 mg Oral BID  . docusate sodium  100 mg Oral BID  . enoxaparin (LOVENOX) injection  30 mg Subcutaneous Q12H  . feeding supplement  1 Container Oral TID BM  . furosemide  20 mg Intravenous Once  . glycopyrrolate  1 mg Oral TID  . guaiFENesin  600 mg Oral TID  . mouth rinse  15 mL Mouth Rinse q12n4p  . methocarbamol  1,000 mg Oral Q8H  .  pantoprazole  40 mg Oral Daily  . QUEtiapine  150 mg Oral Q6H  . Racepinephrine HCl  0.5 mL Nebulization Once  . tamsulosin  0.4 mg Oral Daily   Continuous: . sodium chloride Stopped (06/29/19 0939)  . cefTRIAXone (ROCEPHIN)  IV 2 g (07/02/19 1006)  . dexmedetomidine (PRECEDEX) IV infusion 1.9 mcg/kg/hr (07/02/19 1047)    Assessment/Plan: Transverse left temporal bone fracture. No otic capsule involvement. - Facial nerve function is intact. Hearing appears grossly normal. - Patient may follow up with me as an outpatient for formal hearing evaluation.   LOS: 10 days   Brandon Flowers 07/02/2019, 11:24 AM

## 2019-07-02 NOTE — Progress Notes (Signed)
RN notified MD of patient's persistent non compliance with wearing much needed oxygen mask and increased agitation. Orders received. RN will continue to monitor.
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
## 2019-07-03 ENCOUNTER — Inpatient Hospital Stay (HOSPITAL_COMMUNITY): Payer: Self-pay

## 2019-07-03 LAB — URINALYSIS, ROUTINE W REFLEX MICROSCOPIC
Bilirubin Urine: NEGATIVE
Glucose, UA: NEGATIVE mg/dL
Hgb urine dipstick: NEGATIVE
Ketones, ur: 5 mg/dL — AB
Leukocytes,Ua: NEGATIVE
Nitrite: NEGATIVE
Protein, ur: NEGATIVE mg/dL
Specific Gravity, Urine: 1.017 (ref 1.005–1.030)
pH: 6 (ref 5.0–8.0)

## 2019-07-03 LAB — GLUCOSE, CAPILLARY
Glucose-Capillary: 103 mg/dL — ABNORMAL HIGH (ref 70–99)
Glucose-Capillary: 105 mg/dL — ABNORMAL HIGH (ref 70–99)
Glucose-Capillary: 90 mg/dL (ref 70–99)
Glucose-Capillary: 93 mg/dL (ref 70–99)
Glucose-Capillary: 98 mg/dL (ref 70–99)

## 2019-07-03 LAB — CBC
HCT: 29.5 % — ABNORMAL LOW (ref 39.0–52.0)
Hemoglobin: 9.4 g/dL — ABNORMAL LOW (ref 13.0–17.0)
MCH: 30.8 pg (ref 26.0–34.0)
MCHC: 31.9 g/dL (ref 30.0–36.0)
MCV: 96.7 fL (ref 80.0–100.0)
Platelets: 538 10*3/uL — ABNORMAL HIGH (ref 150–400)
RBC: 3.05 MIL/uL — ABNORMAL LOW (ref 4.22–5.81)
RDW: 14.3 % (ref 11.5–15.5)
WBC: 14.5 10*3/uL — ABNORMAL HIGH (ref 4.0–10.5)
nRBC: 0 % (ref 0.0–0.2)

## 2019-07-03 MED ORDER — SODIUM CHLORIDE 0.9 % IV SOLN
2.0000 g | Freq: Three times a day (TID) | INTRAVENOUS | Status: DC
  Administered 2019-07-03 – 2019-07-04 (×3): 2 g via INTRAVENOUS
  Filled 2019-07-03 (×5): qty 2

## 2019-07-03 MED ORDER — VANCOMYCIN HCL 10 G IV SOLR
1250.0000 mg | Freq: Three times a day (TID) | INTRAVENOUS | Status: DC
  Administered 2019-07-03 – 2019-07-04 (×3): 1250 mg via INTRAVENOUS
  Filled 2019-07-03 (×5): qty 1250

## 2019-07-03 NOTE — Progress Notes (Signed)
Patient refuses to take his PO meds. Pt. Educated. RN will continue to monitor.
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
## 2019-07-04 ENCOUNTER — Inpatient Hospital Stay (HOSPITAL_COMMUNITY): Payer: Self-pay

## 2019-07-04 LAB — BASIC METABOLIC PANEL
Anion gap: 10 (ref 5–15)
BUN: 18 mg/dL (ref 6–20)
CO2: 26 mmol/L (ref 22–32)
Calcium: 7.9 mg/dL — ABNORMAL LOW (ref 8.9–10.3)
Chloride: 102 mmol/L (ref 98–111)
Creatinine, Ser: 0.91 mg/dL (ref 0.61–1.24)
GFR calc Af Amer: >60 mL/min (ref >60)
GFR calc non Af Amer: >60 mL/min (ref >60)
Glucose, Bld: 107 mg/dL — ABNORMAL HIGH (ref 70–99)
Potassium: 4 mmol/L (ref 3.5–5.1)
Sodium: 138 mmol/L (ref 135–145)

## 2019-07-04 LAB — PHOSPHORUS: Phosphorus: 3.7 mg/dL (ref 2.5–4.6)

## 2019-07-04 LAB — CBC
HCT: 28.6 % — ABNORMAL LOW (ref 39.0–52.0)
Hemoglobin: 9.2 g/dL — ABNORMAL LOW (ref 13.0–17.0)
MCH: 31.7 pg (ref 26.0–34.0)
MCHC: 32.2 g/dL (ref 30.0–36.0)
MCV: 98.6 fL (ref 80.0–100.0)
Platelets: 526 10*3/uL — ABNORMAL HIGH (ref 150–400)
RBC: 2.9 MIL/uL — ABNORMAL LOW (ref 4.22–5.81)
RDW: 14.2 % (ref 11.5–15.5)
WBC: 10.9 10*3/uL — ABNORMAL HIGH (ref 4.0–10.5)
nRBC: 0 % (ref 0.0–0.2)

## 2019-07-04 LAB — CULTURE, RESPIRATORY W GRAM STAIN: Special Requests: NORMAL

## 2019-07-04 LAB — GLUCOSE, CAPILLARY
Glucose-Capillary: 100 mg/dL — ABNORMAL HIGH (ref 70–99)
Glucose-Capillary: 104 mg/dL — ABNORMAL HIGH (ref 70–99)
Glucose-Capillary: 107 mg/dL — ABNORMAL HIGH (ref 70–99)
Glucose-Capillary: 126 mg/dL — ABNORMAL HIGH (ref 70–99)
Glucose-Capillary: 141 mg/dL — ABNORMAL HIGH (ref 70–99)
Glucose-Capillary: 94 mg/dL (ref 70–99)
Glucose-Capillary: 94 mg/dL (ref 70–99)

## 2019-07-04 LAB — MAGNESIUM: Magnesium: 1.8 mg/dL (ref 1.7–2.4)

## 2019-07-04 MED ORDER — MAGNESIUM SULFATE 2 GM/50ML IV SOLN
2.0000 g | Freq: Once | INTRAVENOUS | Status: AC
  Administered 2019-07-04: 2 g via INTRAVENOUS
  Filled 2019-07-04: qty 50

## 2019-07-04 MED ORDER — QUETIAPINE FUMARATE 200 MG PO TABS
200.0000 mg | ORAL_TABLET | Freq: Four times a day (QID) | ORAL | Status: DC
  Administered 2019-07-04 – 2019-07-07 (×12): 200 mg via ORAL
  Filled 2019-07-04 (×12): qty 1

## 2019-07-04 MED ORDER — FUROSEMIDE 10 MG/ML IJ SOLN
40.0000 mg | Freq: Once | INTRAMUSCULAR | Status: AC
  Administered 2019-07-04: 40 mg via INTRAVENOUS
  Filled 2019-07-04: qty 4
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
## 2019-07-04 NOTE — Progress Notes (Signed)
Inpatient Rehab Admissions:  Inpatient Rehab Consult received.  I met with pt's mother at the bedside for rehabilitation assessment and to discuss goals and expectations of an inpatient rehab admission.  She agrees he will need aggressive rehab and is able to provide 24/7 supervision at discharge.  She lives with pt's brother, Dellis Filbert, and I will need to confirm discharge setting prior to potentially admitting pt when medically ready and bed available.   Signed: Shann Medal, PT, DPT Admissions Coordinator [phone] 07/04/19  2:29 PM
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
## 2019-07-04 NOTE — Progress Notes (Signed)
Patient is very irritable and agitated today. He has made threats to the staff and is refusing to comply with our orders. He refuses to wear his oxygen mask (his O2 is in the 80's). He is wanting to go home and gets very angry when told he is not going home today. Patient's mother is at the bedside. Her presence does not keep patient calm.  Yer Castello, Rande Brunt, RN

## 2019-07-05 LAB — GLUCOSE, CAPILLARY
Glucose-Capillary: 99 mg/dL (ref 70–99)
Glucose-Capillary: 114 mg/dL — ABNORMAL HIGH (ref 70–99)
Glucose-Capillary: 116 mg/dL — ABNORMAL HIGH (ref 70–99)
Glucose-Capillary: 116 mg/dL — ABNORMAL HIGH (ref 70–99)

## 2019-07-05 LAB — CBC
HCT: 26.2 % — ABNORMAL LOW (ref 39.0–52.0)
Hemoglobin: 8.7 g/dL — ABNORMAL LOW (ref 13.0–17.0)
MCH: 31.5 pg (ref 26.0–34.0)
MCHC: 33.2 g/dL (ref 30.0–36.0)
MCV: 94.9 fL (ref 80.0–100.0)
Platelets: 514 10*3/uL — ABNORMAL HIGH (ref 150–400)
RBC: 2.76 MIL/uL — ABNORMAL LOW (ref 4.22–5.81)
RDW: 14.1 % (ref 11.5–15.5)
WBC: 9 10*3/uL (ref 4.0–10.5)
nRBC: 0 % (ref 0.0–0.2)

## 2019-07-05 LAB — PHOSPHORUS: Phosphorus: 3.7 mg/dL (ref 2.5–4.6)

## 2019-07-05 LAB — BASIC METABOLIC PANEL
Anion gap: 12 (ref 5–15)
BUN: 13 mg/dL (ref 6–20)
CO2: 26 mmol/L (ref 22–32)
Calcium: 7.6 mg/dL — ABNORMAL LOW (ref 8.9–10.3)
Chloride: 98 mmol/L (ref 98–111)
Creatinine, Ser: 0.67 mg/dL (ref 0.61–1.24)
GFR calc Af Amer: >60 mL/min (ref >60)
GFR calc non Af Amer: >60 mL/min (ref >60)
Glucose, Bld: 108 mg/dL — ABNORMAL HIGH (ref 70–99)
Potassium: 3.6 mmol/L (ref 3.5–5.1)
Sodium: 136 mmol/L (ref 135–145)

## 2019-07-05 LAB — MAGNESIUM: Magnesium: 2 mg/dL (ref 1.7–2.4)
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
## 2019-07-05 NOTE — Progress Notes (Signed)
Order to disclose medical information paperwork in paper chart per [employer].  Consult [employer] prior to discharge.

## 2019-07-06 ENCOUNTER — Inpatient Hospital Stay (HOSPITAL_COMMUNITY): Payer: Self-pay

## 2019-07-06 DIAGNOSIS — M7989 Other specified soft tissue disorders: Secondary | ICD-10-CM

## 2019-07-06 LAB — CBC
HCT: 29.3 % — ABNORMAL LOW (ref 39.0–52.0)
Hemoglobin: 9.5 g/dL — ABNORMAL LOW (ref 13.0–17.0)
MCH: 30.9 pg (ref 26.0–34.0)
MCHC: 32.4 g/dL (ref 30.0–36.0)
MCV: 95.4 fL (ref 80.0–100.0)
Platelets: 564 10*3/uL — ABNORMAL HIGH (ref 150–400)
RBC: 3.07 MIL/uL — ABNORMAL LOW (ref 4.22–5.81)
RDW: 14.2 % (ref 11.5–15.5)
WBC: 7.2 10*3/uL (ref 4.0–10.5)
nRBC: 0 % (ref 0.0–0.2)

## 2019-07-06 LAB — PHOSPHORUS: Phosphorus: 3.6 mg/dL (ref 2.5–4.6)

## 2019-07-06 LAB — GLUCOSE, CAPILLARY: Glucose-Capillary: 131 mg/dL — ABNORMAL HIGH (ref 70–99)

## 2019-07-06 LAB — BASIC METABOLIC PANEL
Anion gap: 10 (ref 5–15)
BUN: 12 mg/dL (ref 6–20)
CO2: 24 mmol/L (ref 22–32)
Calcium: 8.2 mg/dL — ABNORMAL LOW (ref 8.9–10.3)
Chloride: 103 mmol/L (ref 98–111)
Creatinine, Ser: 0.68 mg/dL (ref 0.61–1.24)
GFR calc Af Amer: >60 mL/min (ref >60)
GFR calc non Af Amer: >60 mL/min (ref >60)
Glucose, Bld: 104 mg/dL — ABNORMAL HIGH (ref 70–99)
Potassium: 4.3 mmol/L (ref 3.5–5.1)
Sodium: 137 mmol/L (ref 135–145)

## 2019-07-06 LAB — MAGNESIUM: Magnesium: 2 mg/dL (ref 1.7–2.4)

## 2019-07-06 MED ORDER — ORAL CARE MOUTH RINSE
15.0000 mL | Freq: Two times a day (BID) | OROMUCOSAL | Status: DC
  Administered 2019-07-07: 15 mL via OROMUCOSAL

## 2019-07-06 MED ORDER — HALOPERIDOL 5 MG PO TABS
5.0000 mg | ORAL_TABLET | Freq: Four times a day (QID) | ORAL | Status: DC
  Administered 2019-07-06 – 2019-07-07 (×2): 5 mg via ORAL
  Filled 2019-07-06 (×6): qty 1

## 2019-07-06 MED ORDER — FUROSEMIDE 10 MG/ML IJ SOLN
40.0000 mg | Freq: Once | INTRAMUSCULAR | Status: AC
  Administered 2019-07-06: 40 mg via INTRAVENOUS
  Filled 2019-07-06: qty 4
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
## 2019-07-06 NOTE — Progress Notes (Signed)
Left upper extremity venous  has been completed. Refer to [facility] under chart review to view preliminary results. Results given to Amy, patient's nurse.   07/06/2019  1:06 PM [name], Bonnye Fava
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
## 2019-07-07 LAB — CBC
HCT: 29.5 % — ABNORMAL LOW (ref 39.0–52.0)
Hemoglobin: 9.7 g/dL — ABNORMAL LOW (ref 13.0–17.0)
MCH: 31.1 pg (ref 26.0–34.0)
MCHC: 32.9 g/dL (ref 30.0–36.0)
MCV: 94.6 fL (ref 80.0–100.0)
Platelets: 695 10*3/uL — ABNORMAL HIGH (ref 150–400)
RBC: 3.12 MIL/uL — ABNORMAL LOW (ref 4.22–5.81)
RDW: 14.6 % (ref 11.5–15.5)
WBC: 9.4 10*3/uL (ref 4.0–10.5)
nRBC: 0 % (ref 0.0–0.2)

## 2019-07-07 LAB — BASIC METABOLIC PANEL
Anion gap: 13 (ref 5–15)
BUN: 12 mg/dL (ref 6–20)
CO2: 25 mmol/L (ref 22–32)
Calcium: 8.5 mg/dL — ABNORMAL LOW (ref 8.9–10.3)
Chloride: 98 mmol/L (ref 98–111)
Creatinine, Ser: 0.7 mg/dL (ref 0.61–1.24)
GFR calc Af Amer: >60 mL/min (ref >60)
GFR calc non Af Amer: >60 mL/min (ref >60)
Glucose, Bld: 107 mg/dL — ABNORMAL HIGH (ref 70–99)
Potassium: 4.1 mmol/L (ref 3.5–5.1)
Sodium: 136 mmol/L (ref 135–145)

## 2019-07-07 LAB — PHOSPHORUS: Phosphorus: 3.2 mg/dL (ref 2.5–4.6)

## 2019-07-07 LAB — MAGNESIUM: Magnesium: 1.8 mg/dL (ref 1.7–2.4)

## 2019-07-07 MED ORDER — OXYCODONE HCL 5 MG PO TABS: 5.0000 mg | ORAL_TABLET | Freq: Four times a day (QID) | ORAL | 0 refills | Status: DC | PRN

## 2019-07-07 MED ORDER — HYDROMORPHONE HCL 1 MG/ML IJ SOLN
1.0000 mg | INTRAMUSCULAR | Status: DC | PRN
  Administered 2019-07-07: 1 mg via INTRAVENOUS
  Filled 2019-07-07: qty 1

## 2019-07-07 MED ORDER — CLONAZEPAM 1 MG PO TABS: 1.0000 mg | ORAL_TABLET | Freq: Two times a day (BID) | ORAL | 0 refills | Status: DC

## 2019-07-07 MED ORDER — METHOCARBAMOL 500 MG PO TABS: 500.0000 mg | ORAL_TABLET | Freq: Three times a day (TID) | ORAL | 0 refills | Status: DC | PRN

## 2019-07-07 NOTE — Discharge Instructions (Signed)
PNEUMOTHORAX OR HEMOTHORAX +/- RIB FRACTURES  HOME INSTRUCTIONS   1. PAIN CONTROL:  1. Pain is best controlled by a usual combination of three different methods TOGETHER:  i. Ice/Heat ii. Over the counter pain medication iii. Prescription pain medication 2. You may experience some swelling and bruising in area of broken ribs. Ice packs or heating pads (30-60 minutes up to 6 times a day) will help. Use ice for the first few days to help decrease swelling and bruising, then switch to heat to help relax tight/sore spots and speed recovery. Some people prefer to use ice alone, heat alone, alternating between ice & heat. Experiment to what works for you. Swelling and bruising can take several weeks to resolve.  3. It is helpful to take an over-the-counter pain medication regularly for the first few weeks. Choose one of the following that works best for you:  i. Naproxen (Aleve, etc) Two 220mg  tabs twice a day ii. Ibuprofen (Advil, etc) Three 200mg  tabs four times a day (every meal & bedtime) iii. Acetaminophen (Tylenol, etc) 500-650mg  four times a day (every meal & bedtime) 4. A prescription for pain medication (such as oxycodone, hydrocodone, etc) may be given to you upon discharge. Take your pain medication as prescribed.  i. If you are having problems/concerns with the prescription medicine (does not control pain, nausea, vomiting, rash, itching, etc), please call us [phone] to see if we need to switch you to a different pain medicine that will work better for you and/or control your side effect better. ii. If you need a refill on your pain medication, please contact your pharmacy. They will contact our office to request authorization. Prescriptions will not be filled after 5 pm or on week-ends. 1. Avoid getting constipated. When taking pain medications, it is common to experience some constipation. Increasing fluid intake and taking a fiber supplement (such as Metamucil, Citrucel, FiberCon,  MiraLax, etc) 1-2 times a day regularly will usually help prevent this problem from occurring. A mild laxative (prune juice, Milk of Magnesia, MiraLax, etc) should be taken according to package directions if there are no bowel movements after 48 hours.  2. Watch out for diarrhea. If you have many loose bowel movements, simplify your diet to bland foods & liquids for a few days. Stop any stool softeners and decrease your fiber supplement. Switching to mild anti-diarrheal medications (Kayopectate, Pepto Bismol) can help. If this worsens or does not improve, please call us. 3. Chest tube site wound: you may remove the dressing from your chest tube site 3 days after the removal of your chest tube. DO NOT shower over the dressing. Once   removed, you may shower as normal. Do not submerge your wound in water for 2-3 weeks.  4. FOLLOW UP  a. Please call our office to set up or confirm an appointment for follow up for 2 weeks after discharge. You will need to get a chest [facility] at either [facility]. This will be outlined in your follow up instructions. Please call [facility] at [phone] if you have any questions about follow up.  b. If you have any orthopedic or other injuries you will need to follow up as outlined in your follow up instructions.   WHEN TO CALL US [phone]:  1. Poor pain control 2. Reactions / problems with new medications (rash/itching, nausea, etc)  3. Fever over 101.5 F (38.5 C) 4. Worsening swelling or bruising 5. Redness, drainage, pain or swelling around chest  tube site 6. Worsening pain, productive cough, difficulty breathing or any other concerning symptoms  The clinic staff is available to answer your questions during regular business hours (8:30am-5pm). Please dont hesitate to call and ask to speak to one of our nurses for clinical concerns.  If you have a medical emergency, go to the nearest emergency room or call 911.  A surgeon from [facility] is always on call at the [facility], [place], [place], [place], [place] [address] ?  MAIN: [phone] ? TOLL FREE: [phone] ?  FAX [phone]  [email or web address]      Information on Rib Fractures  A rib fracture is a break or crack in one of the bones of the ribs. The ribs are long, curved bones that wrap around your chest and attach to your spine and your breastbone. The ribs protect your heart, lungs, and other organs in the chest. A broken or cracked rib is often painful but is not usually serious. Most rib fractures heal on their own over time. However, rib fractures can be more serious if multiple ribs are broken or if broken ribs move out of place and push against other structures or organs. What are the causes? This condition is caused by:  Repetitive movements with high force, such as pitching a baseball or having severe coughing spells.  A direct blow to the chest, such as a sports injury, a car accident, or a fall.  Cancer that has spread to the bones, which can weaken bones and cause them to break. What are the signs or symptoms? Symptoms of this condition include:  Pain when you breathe in or cough.  Pain when someone presses on the injured area.  Feeling short of breath. How is this diagnosed? This condition is diagnosed with a physical exam and medical history. Imaging tests may also be done, such as:  Chest X-ray.  CT scan.  MRI.  Bone scan.  Chest ultrasound. How is this treated? Treatment for this condition depends on the severity of the fracture. Most rib fractures usually heal on their own in 1-3 months. Sometimes healing takes longer if there is a cough that does not stop or if there are other activities that make the injury worse (aggravating factors). While you heal, you will be given medicines to control the pain. You will also be taught deep breathing  exercises. Severe injuries may require hospitalization or surgery. Follow these instructions at home: Managing pain, stiffness, and swelling  If directed, apply ice to the injured area. ? Put ice in a plastic bag. ? Place a towel between your skin and the bag. ? Leave the ice on for 20 minutes, 2-3 times a day.  Take over-the-counter and prescription medicines only as told by your health care provider. Activity  Avoid a lot of activity and any activities or movements that cause pain. Be careful during activities and avoid bumping the injured rib.  Slowly increase your activity as told by your health care provider. General instructions  Do deep breathing exercises as told by your health care provider. This helps prevent pneumonia, which is a common complication of a broken rib. Your health care provider may instruct you to: ? Take deep breaths several times a day. ? Try to cough several times a day, holding a pillow against the injured area. ? Use a device called incentive spirometer to practice deep breathing several times a day.  Drink enough  fluid to keep your urine pale yellow.  Do not wear a rib belt or binder. These restrict breathing, which can lead to pneumonia.  Keep all follow-up visits as told by your health care provider. This is important. Contact a health care provider if:  You have a fever. Get help right away if:  You have difficulty breathing or you are short of breath.  You develop a cough that does not stop, or you cough up thick or bloody sputum.  You have nausea, vomiting, or pain in your abdomen.  Your pain gets worse and medicine does not help. Summary  A rib fracture is a break or crack in one of the bones of the ribs.  A broken or cracked rib is often painful but is not usually serious.  Most rib fractures heal on their own over time.  Treatment for this condition depends on the severity of the fracture.  Avoid a lot of activity and any  activities or movements that cause pain. This information is not intended to replace advice given to you by your health care provider. Make sure you discuss any questions you have with your health care provider. Document Released: 08/04/2005 Document Revised: 11/03/2016 Document Reviewed: 11/03/2016 Elsevier Interactive Patient Education  2019 [employer] Inc[employer]    Pneumothorax A pneumothorax is commonly called a collapsed lung. It is a condition in which air leaks from a lung and builds up between the thin layer of tissue that covers the lungs (visceral pleura) and the interior wall of the chest cavity (parietal pleura). The air gets trapped outside the lung, between the lung and the chest wall (pleural space). The air takes up space and prevents the lung from fully expanding. This condition sometimes occurs suddenly with no apparent cause. The buildup of air may be small or large. A small pneumothorax may go away on its own. A large pneumothorax will require treatment and hospitalization. What are the causes? This condition may be caused by:  Trauma and injury to the chest wall.  Surgery and other medical procedures.  A complication of an underlying lung problem, especially chronic obstructive pulmonary disease (COPD) or emphysema. Sometimes the cause of this condition is not known. What increases the risk? You are more likely to develop this condition if:  You have an underlying lung problem.  You smoke.  You are 47-44 years old, male, tall, and underweight.  You have a personal or family history of pneumothorax.  You have an eating disorder (anorexia nervosa). This condition can also happen quickly, even in people with no history of lung problems. What are the signs or symptoms? Sometimes a pneumothorax will have no symptoms. When symptoms are present, they can include:  Chest pain.  Shortness of breath.  Increased rate of breathing.  Bluish color to your lips or skin  (cyanosis). How is this diagnosed? This condition may be diagnosed by:  A medical history and physical exam.  A chest X-ray, chest CT scan, or ultrasound. How is this treated? Treatment depends on how severe your condition is. The goal of treatment is to remove the extra air and allow your lung to expand back to its normal size.  For a small pneumothorax: ? No treatment may be needed. ? Extra oxygen is sometimes used to make it go away more quickly.  For a large pneumothorax or a pneumothorax that is causing symptoms, a procedure is done to drain the air from your lungs. To do this, a health care provider may  use: ? A needle with a syringe. This is used to suck air from a pleural space where no additional leakage is taking place. ? A chest tube. This is used to suck air where there is ongoing leakage into the pleural space. The chest tube may need to remain in place for several days until the air leak has healed.  In more severe cases, surgery may be needed to repair the damage that is causing the leak.  If you have multiple pneumothorax episodes or have an air leak that will not heal, a procedure called a pleurodesis may be done. A medicine is placed in the pleural space to irritate the tissues around the lung so that the lung will stick to the chest wall, seal any leaks, and stop any buildup of air in that space. If you have an underlying lung problem, severe symptoms, or a large pneumothorax you will usually need to stay in the hospital. Follow these instructions at home: Lifestyle  Do not use any products that contain nicotine or tobacco, such as cigarettes and e-cigarettes. These are major risk factors in pneumothorax. If you need help quitting, ask your health care provider.  Do not lift anything that is heavier than 10 lb (4.5 kg), or the limit that your health care provider tells you, until he or she says that it is safe.  Avoid activities that take a lot of effort (strenuous)  for as long as told by your health care provider.  Return to your normal activities as told by your health care provider. Ask your health care provider what activities are safe for you.  Do not fly in an airplane or scuba dive until your health care provider says it is okay. General instructions  Take over-the-counter and prescription medicines only as told by your health care provider.  If a cough or pain makes it difficult for you to sleep at night, try sleeping in a semi-upright position in a recliner or by using 2 or 3 pillows.  If you had a chest tube and it was removed, ask your health care provider when you can remove the bandage (dressing). While the dressing is in place, do not allow it to get wet.  Keep all follow-up visits as told by your health care provider. This is important. Contact a health care provider if:  You cough up thick mucus (sputum) that is yellow or green in color.  You were treated with a chest tube, and you have redness, increasing pain, or discharge at the site where it was placed. Get help right away if:  You have increasing chest pain or shortness of breath.  You have a cough that will not go away.  You begin coughing up blood.  You have pain that is getting worse or is not controlled with medicines.  The site where your chest tube was located opens up.  You feel air coming out of the site where the chest tube was placed.  You have a fever or persistent symptoms for more than 2-3 days.  You have a fever and your symptoms suddenly get worse. These symptoms may represent a serious problem that is an emergency. Do not wait to see if the symptoms will go away. Get medical help right away. Call your local emergency services (911 in the [place] Do not drive yourself to the hospital. Summary  A pneumothorax, commonly called a collapsed lung, is a condition in which air leaks from a lung and gets trapped between the  lung and the chest wall (pleural  space).  The buildup of air may be small or large. A small pneumothorax may go away on its own. A large pneumothorax will require treatment and hospitalization.  Treatment for this condition depends on how severe the pneumothorax is. The goal of treatment is to remove the extra air and allow the lung to expand back to its normal size. This information is not intended to replace advice given to you by your health care provider. Make sure you discuss any questions you have with your health care provider. Document Released: 08/04/2005 Document Revised: 07/13/2017 Document Reviewed: 07/13/2017 Elsevier Interactive Patient Education  2019 Elsevier Inc.   Subarachnoid Hemorrhage  Subarachnoid hemorrhage is bleeding between the brain and the layer that covers the brain. The bleeding puts pressure on the brain, and it stops blood from going to some areas of the brain. If this bleeding is not treated, it may cause brain damage, stroke, or death. This is an emergency. You must be treated in the hospital right away. You are more likely to get this condition if you:  Smoke.  Have high blood pressure.  Drink too much alcohol.  Are older than age 10.  Are male, especially if you have stopped getting your period for a year or longer (menopause).  Have a family history of burst blood vessels (aneurysms).  Have a certain syndrome that leads to one of these: ? Kidney disease. ? Disease of tissues like bones, blood, and fat (connective tissues). Signs of this bleeding condition include:  Sudden, very bad headache. It may feel like the worst headache you have ever had.  Feeling sick to your stomach (nausea) or throwing up (vomiting), especially if you have other signs such as a headache.  Sudden weakness or loss of feeling (numbness) in your face, arm, or leg, especially on one side of the body.  Sudden trouble with any of these: ? Walking. ? Moving an arm or leg. ? Talking. ? Understanding  what people say. ? Swallowing. ? Seeing out of one eye or both eyes.  Sudden confusion.  Seeing double.  Loss of balance.  Sensitivity to light.  Stiff neck.  Trouble staying awake.  Passing out (fainting). Follow these instructions at home: Medicines  Take over-the-counter and prescription medicines only as told by your doctor.  Do not take any medicines that contain aspirin or NSAIDs (like ibuprofen) unless your doctor says that it is safe to take them. Lifestyle  Do not use any products that have nicotine or tobacco. These include cigarettes and e-cigarettes. If you need help quitting, ask your doctor.  Limit alcohol to 1 drink a day for nonpregnant women and 2 drinks a day for men. One drink is equal to: ? 12 oz of beer. ? 5 oz of wine. ? 1 oz of hard liquor. Eating and drinking  Ask your doctor if it is safe for you to eat and drink. You may need tests to make sure that you can swallow safely (swallow studies). Driving  Do not drive until your doctor says that it is safe to drive.  Do not drive or use heavy machinery while taking prescription pain medicine. General instructions  Do therapy as recommended. This may include: ? Physical therapy (PT). ? Occupational therapy (OT). ? Speech-language therapy.  Rest and limit activity as told by your doctor. Rest helps your brain to heal. Make sure you: ? Get plenty of sleep. ? Avoid activities that cause stress to your  body or mind.  Check your blood pressure as told by your doctor. Write down your blood pressure.  Keep all follow-up visits as told by your doctors. This is important. Contact a doctor if:  You have a stiff neck.  You have a cough.  You have a fever. Get help right away if:  You have any signs of a stroke. "BE FAST" is an easy way to remember the main warning signs: ? B - Balance. Signs are dizziness, sudden trouble walking, or loss of balance. ? E - Eyes. Signs are trouble seeing or a  sudden change in how you see. ? F - Face. Signs are sudden weakness or loss of feeling of the face, or the face or eyelid drooping on one side. ? A - Arms. Signs are weakness or loss of feeling in an arm. This happens suddenly and usually on one side of the body. ? S - Speech. Signs are sudden trouble speaking, slurred speech, or trouble understanding what people say. ? T - Time. Time to call emergency services. Write down what time symptoms started.  You have other signs of a stroke, such as: ? A sudden, very bad headache with no known cause. ? Feeling sick to your stomach. ? Throwing up. ? Jerky movements you cannot control (seizure). These symptoms may be an emergency. Do not wait to see if the symptoms will go away. Get medical help right away. Call your local emergency services (911 in the [place] Do not drive yourself to the hospital. Summary  Subarachnoid hemorrhage is bleeding in the brain. It is an emergency. You must be treated in the hospital right away.  Follow instructions from your doctor about eating, resting, and taking medicines.  Do not take any medicines that contain aspirin or NSAIDs (like ibuprofen) unless your doctor says that it is safe to take them. This information is not intended to replace advice given to you by your health care provider. Make sure you discuss any questions you have with your health care provider. Document Released: 11/29/2012 Document Revised: 07/17/2017 Document Reviewed: 05/14/2017 Elsevier Patient Education  2020 Elsevier Inc.  Clavicle Fracture A clavicle fracture is a break in the long bone that connects the shoulder to the chest wall (clavicle). The clavicle is also called the collarbone. A clavicle fracture is a common injury that can happen at any age. What are the causes? Common causes of a clavicle fracture include:  A hard, direct hit (blow) to the shoulder.  A car accident.  A fall, especially if you try to break your fall with  an outstretched arm. What increases the risk? You are more likely to develop this condition if:  You are younger than 26 or older than 75. Most clavicle fractures happen to people who are younger than 25.  You are a male.  You play contact sports. What are the signs or symptoms? Symptoms of this condition include:  Pain.  Difficulty moving the arm.  A shoulder that drops downward and forward.  Pain when trying to lift the shoulder.  Bruising, swelling, and tenderness over the clavicle.  A grinding noise when you try to move the shoulder.  A bump over the clavicle. How is this diagnosed? This condition is diagnosed based on:  Your medical history.  A physical exam.  X-rays to determine the position of your clavicle. How is this treated? Treatment for this condition depends on the position of your clavicle after the fracture:  If the broken ends  of the bone are not out of place, your health care provider may put your arm in a sling.  If the broken ends of the bone are out of place, you may need surgery. Surgery may involve placing screws, pins, or plates to keep your clavicle stable while it heals. When your health care provider thinks your fracture has healed enough, you may have to do physical therapy to regain normal movement and build up your arm strength. Follow these instructions at home: If you have a sling:   Wear the sling as told by your health care provider. Remove it only as told by your health care provider.  Loosen the sling if your fingers tingle, become numb, or turn cold and blue.  Do not lift your arm. Keep it across your chest.  Keep the sling clean.  Ask your health care provider if you may remove the sling for bathing. ? If your sling is not waterproof, do not let it get wet. Cover the sling with a watertight covering if you take a bath or a shower while wearing it. ? If you may remove your sling when you take a bath or a shower, keep your  shoulder in the same position as when the sling is on. Managing pain, stiffness, and swelling   If directed, put ice on the injured area: ? If you have a removable sling, remove it as told by your health care provider. ? Put ice in a plastic bag. ? Place a towel between your skin and the bag. ? Leave the ice on for 20 minutes, 2-3 times a day. Activity  Avoid activities that make your symptoms worse for 4-6 weeks, or as long as directed.  Ask your health care provider when it is safe for you to drive.  Do exercises as told by your health care provider. General instructions  Do not use any products that contain nicotine or tobacco, such as cigarettes and e-cigarettes. These can delay bone healing. If you need help quitting, ask your health care provider.  Take over-the-counter and prescription medicines only as told by your health care provider.  Keep all follow-up visits as told by your health care provider. This is important. Contact a health care provider if:  Your medicine is not helping to relieve pain and swelling. Get help right away if:  Your arm is numb, cold, or pale, even when your splint is loose. Summary  The clavicle, also called the collarbone, is the long bone that connects the shoulder to the chest wall.  Treatment for this condition depends on the position of your clavicle after the fracture.  You may need to do physical therapy after your injury has healed enough.  If you have a sling, wear the sling as told by your health care provider. This information is not intended to replace advice given to you by your health care provider. Make sure you discuss any questions you have with your health care provider. Document Released: 05/14/2005 Document Revised: 06/21/2018 Document Reviewed: 06/23/2016 Elsevier Patient Education  2020 [employer]

## 2019-07-07 NOTE — Progress Notes (Signed)
Patient complaining of 10/10 pain in Left shoulder. Trauma MD made aware that oxy PO was only PRN for pain and it wasn't time for another dose. New order for dilaudid IV PRN for severe pain placed. Will continue to monitor.

## 2019-07-07 NOTE — Progress Notes (Addendum)
Pt refuses to maintain LUE in sling and BP cuff/pulse ox/ECG monitor on. MD aware.
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
## 2019-07-08 ENCOUNTER — Encounter (HOSPITAL_COMMUNITY): Payer: Self-pay | Admitting: *Deleted

## 2020-12-14 ENCOUNTER — Emergency Department (HOSPITAL_BASED_OUTPATIENT_CLINIC_OR_DEPARTMENT_OTHER): Payer: Self-pay

## 2020-12-14 ENCOUNTER — Emergency Department (HOSPITAL_BASED_OUTPATIENT_CLINIC_OR_DEPARTMENT_OTHER): Payer: Medicaid Other

## 2020-12-14 ENCOUNTER — Emergency Department (HOSPITAL_BASED_OUTPATIENT_CLINIC_OR_DEPARTMENT_OTHER): Payer: Self-pay | Admitting: Radiology

## 2020-12-14 ENCOUNTER — Other Ambulatory Visit (HOSPITAL_BASED_OUTPATIENT_CLINIC_OR_DEPARTMENT_OTHER): Payer: Self-pay

## 2020-12-14 ENCOUNTER — Encounter (HOSPITAL_BASED_OUTPATIENT_CLINIC_OR_DEPARTMENT_OTHER): Payer: Self-pay

## 2020-12-14 ENCOUNTER — Emergency Department (HOSPITAL_BASED_OUTPATIENT_CLINIC_OR_DEPARTMENT_OTHER)
Admission: EM | Admit: 2020-12-14 | Discharge: 2020-12-14 | Disposition: A | Discharge status: Home / Self Care | Payer: Self-pay | Attending: Emergency Medicine | Admitting: Emergency Medicine

## 2020-12-14 ENCOUNTER — Other Ambulatory Visit: Payer: Self-pay

## 2020-12-14 DIAGNOSIS — S81802A Unspecified open wound, left lower leg, initial encounter: Secondary | ICD-10-CM | POA: Insufficient documentation

## 2020-12-14 DIAGNOSIS — F17200 Nicotine dependence, unspecified, uncomplicated: Secondary | ICD-10-CM | POA: Insufficient documentation

## 2020-12-14 DIAGNOSIS — S92352A Displaced fracture of fifth metatarsal bone, left foot, initial encounter for closed fracture: Secondary | ICD-10-CM | POA: Insufficient documentation

## 2020-12-14 DIAGNOSIS — Y9355 Activity, bike riding: Secondary | ICD-10-CM | POA: Insufficient documentation

## 2020-12-14 LAB — CBC WITH DIFFERENTIAL/PLATELET
Abs Immature Granulocytes: 0.02 10*3/uL (ref 0.00–0.07)
Basophils Absolute: 0.1 10*3/uL (ref 0.0–0.1)
Basophils Relative: 1 %
Eosinophils Absolute: 0.2 10*3/uL (ref 0.0–0.5)
Eosinophils Relative: 2 %
HCT: 37.8 % — ABNORMAL LOW (ref 39.0–52.0)
Hemoglobin: 12.7 g/dL — ABNORMAL LOW (ref 13.0–17.0)
Immature Granulocytes: 0 %
Lymphocytes Relative: 29 %
Lymphs Abs: 2.5 10*3/uL (ref 0.7–4.0)
MCH: 31.8 pg (ref 26.0–34.0)
MCHC: 33.6 g/dL (ref 30.0–36.0)
MCV: 94.5 fL (ref 80.0–100.0)
Monocytes Absolute: 0.7 10*3/uL (ref 0.1–1.0)
Monocytes Relative: 8 %
Neutro Abs: 5.2 10*3/uL (ref 1.7–7.7)
Neutrophils Relative %: 60 %
Platelets: 350 10*3/uL (ref 150–400)
RBC: 4 MIL/uL — ABNORMAL LOW (ref 4.22–5.81)
RDW: 13 % (ref 11.5–15.5)
WBC: 8.6 10*3/uL (ref 4.0–10.5)
nRBC: 0 % (ref 0.0–0.2)

## 2020-12-14 LAB — COMPREHENSIVE METABOLIC PANEL
ALT: 19 U/L (ref 0–44)
AST: 16 U/L (ref 15–41)
Albumin: 4.2 g/dL (ref 3.5–5.0)
Alkaline Phosphatase: 111 U/L (ref 38–126)
Anion gap: 9 (ref 5–15)
BUN: 15 mg/dL (ref 6–20)
CO2: 30 mmol/L (ref 22–32)
Calcium: 9.6 mg/dL (ref 8.9–10.3)
Chloride: 103 mmol/L (ref 98–111)
Creatinine, Ser: 0.96 mg/dL (ref 0.61–1.24)
GFR, Estimated: >60 mL/min (ref >60)
Glucose, Bld: 89 mg/dL (ref 70–99)
Potassium: 3.9 mmol/L (ref 3.5–5.1)
Sodium: 142 mmol/L (ref 135–145)
Total Bilirubin: 0.4 mg/dL (ref 0.3–1.2)
Total Protein: 7.6 g/dL (ref 6.5–8.1)

## 2020-12-14 MED ORDER — DOXYCYCLINE HYCLATE 100 MG PO CAPS: 100.0000 mg | ORAL_CAPSULE | Freq: Two times a day (BID) | ORAL | 0 refills | Status: AC

## 2020-12-14 MED ORDER — IBUPROFEN 800 MG PO TABS: 800.0000 mg | ORAL_TABLET | Freq: Three times a day (TID) | ORAL | 0 refills | Status: DC | PRN
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
## 2021-11-16 IMAGING — DX DG SHOULDER 2+V PORT*R*
1 series · 3 of 3 positions shown · non-contrast
Comparison: None.

CLINICAL DATA: Pain

EXAM:
PORTABLE RIGHT SHOULDER

[Series 1: shoulder · 0.14mm/px · 3 of 3 slices shown]
[im 1/3]
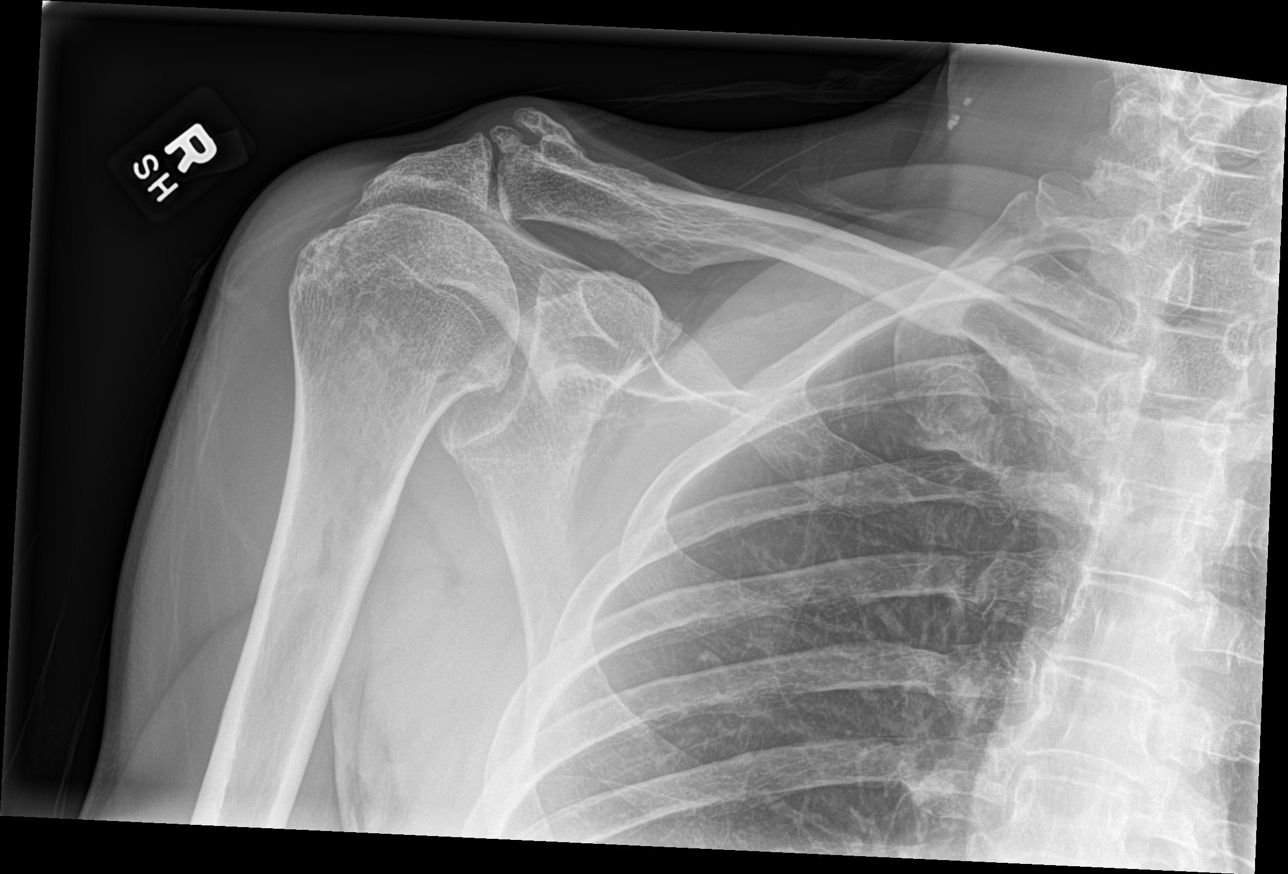
[im 2/3]
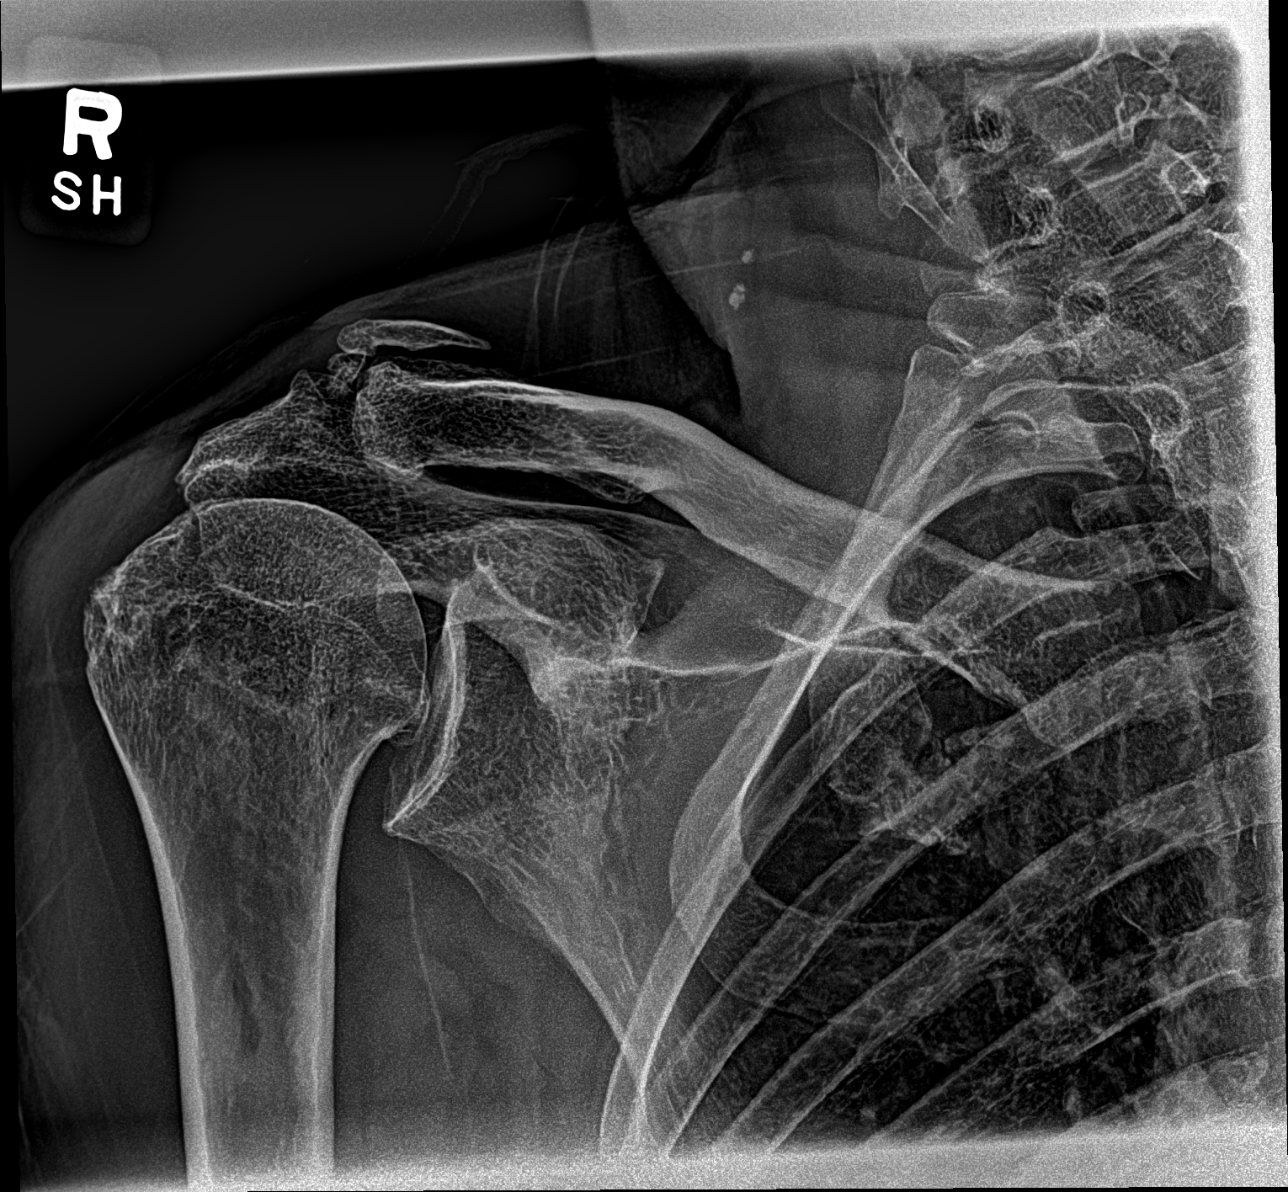
[im 3/3]
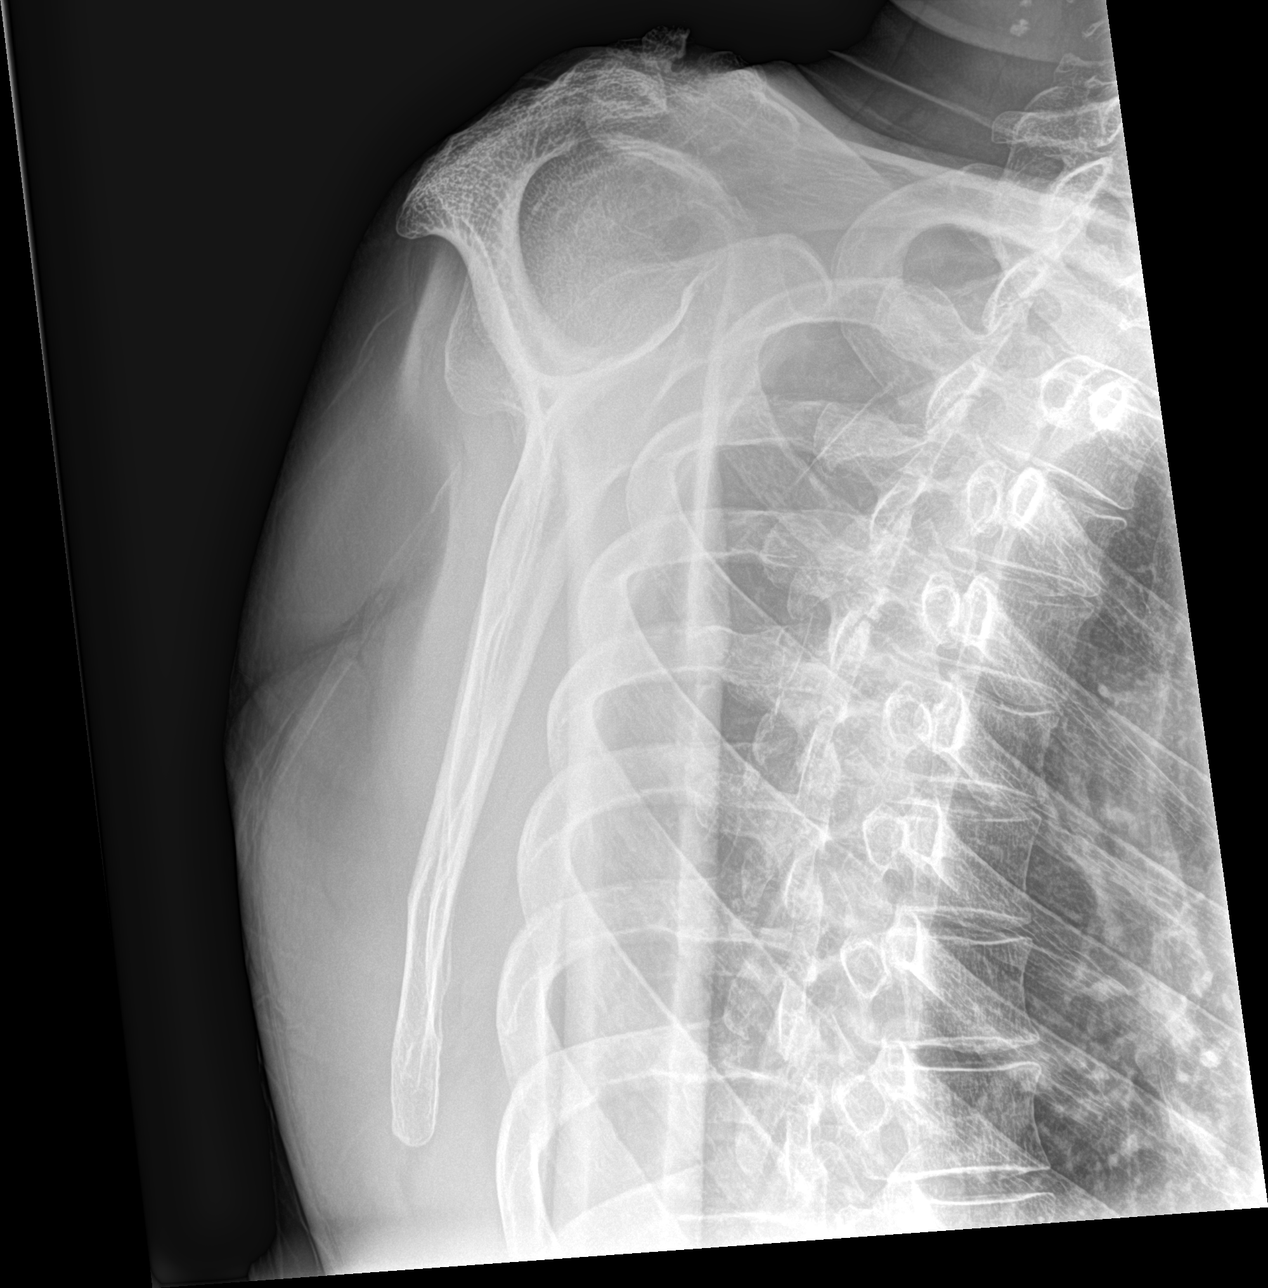

[3 of 3 positions shown; findings below may reference images not displayed]

FINDINGS: There are degenerative changes of the right AC joint and right
glenohumeral joint. There is no acute displaced fracture. No
dislocation.
IMPRESSION: Degenerative changes of the right AC joint and right glenohumeral
joint.

## 2022-04-21 ENCOUNTER — Emergency Department (HOSPITAL_COMMUNITY)
Admission: EM | Admit: 2022-04-21 | Discharge: 2022-04-21 | Disposition: A | Discharge status: Home / Self Care | Payer: Self-pay | Attending: Emergency Medicine | Admitting: Emergency Medicine

## 2022-04-21 ENCOUNTER — Emergency Department (HOSPITAL_COMMUNITY): Payer: Self-pay

## 2022-04-21 ENCOUNTER — Encounter (HOSPITAL_COMMUNITY): Payer: Self-pay

## 2022-04-21 ENCOUNTER — Other Ambulatory Visit: Payer: Self-pay

## 2022-04-21 DIAGNOSIS — S0990XA Unspecified injury of head, initial encounter: Secondary | ICD-10-CM

## 2022-04-21 DIAGNOSIS — M25512 Pain in left shoulder: Secondary | ICD-10-CM | POA: Insufficient documentation

## 2022-04-21 DIAGNOSIS — H538 Other visual disturbances: Secondary | ICD-10-CM | POA: Insufficient documentation

## 2022-04-21 DIAGNOSIS — N390 Urinary tract infection, site not specified: Secondary | ICD-10-CM

## 2022-04-21 DIAGNOSIS — R519 Headache, unspecified: Secondary | ICD-10-CM | POA: Insufficient documentation

## 2022-04-21 DIAGNOSIS — Y92410 Unspecified street and highway as the place of occurrence of the external cause: Secondary | ICD-10-CM | POA: Insufficient documentation

## 2022-04-21 LAB — CBC WITH DIFFERENTIAL/PLATELET
Abs Immature Granulocytes: 0.05 10*3/uL (ref 0.00–0.07)
Basophils Absolute: 0 10*3/uL (ref 0.0–0.1)
Basophils Relative: 0 %
Eosinophils Absolute: 0.2 10*3/uL (ref 0.0–0.5)
Eosinophils Relative: 2 %
HCT: 38.3 % — ABNORMAL LOW (ref 39.0–52.0)
Hemoglobin: 12.3 g/dL — ABNORMAL LOW (ref 13.0–17.0)
Immature Granulocytes: 1 %
Lymphocytes Relative: 23 %
Lymphs Abs: 2.3 10*3/uL (ref 0.7–4.0)
MCH: 31.1 pg (ref 26.0–34.0)
MCHC: 32.1 g/dL (ref 30.0–36.0)
MCV: 97 fL (ref 80.0–100.0)
Monocytes Absolute: 0.7 10*3/uL (ref 0.1–1.0)
Monocytes Relative: 7 %
Neutro Abs: 6.5 10*3/uL (ref 1.7–7.7)
Neutrophils Relative %: 67 %
Platelets: 380 10*3/uL (ref 150–400)
RBC: 3.95 MIL/uL — ABNORMAL LOW (ref 4.22–5.81)
RDW: 14 % (ref 11.5–15.5)
WBC: 9.8 10*3/uL (ref 4.0–10.5)
nRBC: 0 % (ref 0.0–0.2)

## 2022-04-21 LAB — URINALYSIS, ROUTINE W REFLEX MICROSCOPIC
Bilirubin Urine: NEGATIVE
Glucose, UA: NEGATIVE mg/dL
Hgb urine dipstick: NEGATIVE
Ketones, ur: NEGATIVE mg/dL
Nitrite: NEGATIVE
Protein, ur: NEGATIVE mg/dL
Specific Gravity, Urine: 1.024 (ref 1.005–1.030)
WBC, UA: >50 WBC/hpf — ABNORMAL HIGH (ref 0–5)
pH: 6 (ref 5.0–8.0)

## 2022-04-21 LAB — BASIC METABOLIC PANEL
Anion gap: 9 (ref 5–15)
BUN: 22 mg/dL — ABNORMAL HIGH (ref 6–20)
CO2: 26 mmol/L (ref 22–32)
Calcium: 9 mg/dL (ref 8.9–10.3)
Chloride: 103 mmol/L (ref 98–111)
Creatinine, Ser: 0.87 mg/dL (ref 0.61–1.24)
GFR, Estimated: >60 mL/min (ref >60)
Glucose, Bld: 100 mg/dL — ABNORMAL HIGH (ref 70–99)
Potassium: 4.3 mmol/L (ref 3.5–5.1)
Sodium: 138 mmol/L (ref 135–145)

## 2022-04-21 MED ORDER — CIPROFLOXACIN HCL 500 MG PO TABS: 500.0000 mg | ORAL_TABLET | Freq: Two times a day (BID) | ORAL | 0 refills | Status: AC

## 2022-04-21 MED ORDER — IBUPROFEN 200 MG PO TABS
600.0000 mg | ORAL_TABLET | Freq: Once | ORAL | Status: AC
  Administered 2022-04-21: 600 mg via ORAL
  Filled 2022-04-21: qty 3

## 2022-04-21 MED ORDER — ONDANSETRON HCL 4 MG/2ML IJ SOLN
4.0000 mg | Freq: Once | INTRAMUSCULAR | Status: AC
  Administered 2022-04-21: 4 mg via INTRAVENOUS
  Filled 2022-04-21: qty 2

## 2022-04-21 MED ORDER — SODIUM CHLORIDE 0.9 % IV BOLUS
1000.0000 mL | Freq: Once | INTRAVENOUS | Status: AC
  Administered 2022-04-21: 1000 mL via INTRAVENOUS
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
## 2023-09-13 ENCOUNTER — Emergency Department (HOSPITAL_COMMUNITY)
Admission: EM | Admit: 2023-09-13 | Discharge: 2023-09-14 | Disposition: A | Discharge status: Home / Self Care | Payer: 59 | Attending: Emergency Medicine | Admitting: Emergency Medicine

## 2023-09-13 DIAGNOSIS — M79672 Pain in left foot: Secondary | ICD-10-CM | POA: Diagnosis not present

## 2023-09-13 DIAGNOSIS — R519 Headache, unspecified: Secondary | ICD-10-CM | POA: Diagnosis not present

## 2023-09-13 DIAGNOSIS — M79671 Pain in right foot: Secondary | ICD-10-CM | POA: Diagnosis not present

## 2023-09-13 MED ORDER — IBUPROFEN 200 MG PO TABS
ORAL_TABLET | ORAL | Status: AC
  Filled 2023-09-13: qty 1

## 2023-09-13 MED ORDER — IBUPROFEN 200 MG PO TABS
400.0000 mg | ORAL_TABLET | Freq: Once | ORAL | Status: AC | PRN
  Administered 2023-09-13: 400 mg via ORAL
  Filled 2023-09-13: qty 2

## 2023-09-13 NOTE — ED Triage Notes (Signed)
Patient states he has been outside for the last four days, complaints of bilateral foot pain and a headache.

## 2023-09-14 MED ORDER — CLOTRIMAZOLE 1 % EX CREA: TOPICAL_CREAM | CUTANEOUS | 0 refills | Status: DC

## 2023-09-14 MED ORDER — ACETAMINOPHEN 500 MG PO TABS
1000.0000 mg | ORAL_TABLET | Freq: Once | ORAL | Status: AC
  Administered 2023-09-14: 1000 mg via ORAL
  Filled 2023-09-14: qty 2

## 2023-09-14 NOTE — ED Notes (Signed)
Pt refusing to wake up to take tylenol
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
## 2023-09-14 NOTE — Discharge Instructions (Addendum)
For pain control you may take 1000 mg of acetaminophen (Tylenol) every 8 hours and/or 600 mg of Ibuprofen (Motrin, Advil, etc.) every 6-8 hours as needed.  Please limit acetaminophen (Tylenol) to 4000 mg and Ibuprofen (Motrin, Advil, etc.) to 2400 mg for a 24hr period. Please note that other over-the-counter medicine may contain acetaminophen or ibuprofen as a component of their ingredients.

## 2023-10-19 ENCOUNTER — Emergency Department (HOSPITAL_COMMUNITY)
Admission: EM | Admit: 2023-10-19 | Discharge: 2023-10-19 | Disposition: A | Discharge status: Home / Self Care | Attending: Emergency Medicine | Admitting: Emergency Medicine

## 2023-10-19 DIAGNOSIS — M79671 Pain in right foot: Secondary | ICD-10-CM | POA: Diagnosis not present

## 2023-10-19 DIAGNOSIS — M79672 Pain in left foot: Secondary | ICD-10-CM | POA: Insufficient documentation

## 2023-10-19 DIAGNOSIS — J069 Acute upper respiratory infection, unspecified: Secondary | ICD-10-CM | POA: Insufficient documentation

## 2023-10-19 MED ORDER — ONDANSETRON 4 MG PO TBDP: ORAL_TABLET | ORAL | 0 refills | Status: DC

## 2023-10-19 MED ORDER — FLUCONAZOLE 150 MG PO TABS
150.0000 mg | ORAL_TABLET | Freq: Once | ORAL | Status: AC
  Administered 2023-10-19: 150 mg via ORAL
  Filled 2023-10-19: qty 1

## 2023-10-19 MED ORDER — BENZONATATE 100 MG PO CAPS: 100.0000 mg | ORAL_CAPSULE | Freq: Three times a day (TID) | ORAL | 0 refills | Status: DC

## 2023-10-19 NOTE — Discharge Instructions (Signed)
 Take tylenol 2 pills 4 times a day and motrin 4 pills 3 times a day.  Drink plenty of fluids.  Return for worsening shortness of breath, headache, confusion. Follow up with your family doctor.

## 2023-10-19 NOTE — ED Triage Notes (Signed)
 Pt reports thinks his feet are frost bitten, reports seen for it recently and was told it was not frost bite. Pt also c/o fatigue and cold s/s

## 2023-10-19 NOTE — ED Provider Notes (Signed)
 [place] EMERGENCY DEPARTMENT AT [facility] Provider Note   CSN: [id number] Arrival date & time: 10/19/23  0222     History  Chief Complaint  Patient presents with   Frostbite   URI    Brandon Flowers is a 48 y.o. male.  48 yo M with a chief complaints of bilateral foot pain.  This has been going on for some time now.  Has been seen in the ED for this previously.  He was worried that maybe he has frostbite.  He denies any injury to the feet.  In a separate complaints he is also been coughing and congested.  Going on for a couple days.  No known sick contacts.  Describing yellowish sputum.   URI      Home Medications Prior to Admission medications   Medication Sig Start Date End Date Taking? Authorizing Provider  benzonatate (TESSALON) 100 MG capsule Take 1 capsule (100 mg total) by mouth every 8 (eight) hours. 10/19/23  Yes Brandon Plan, DO  ondansetron (ZOFRAN-ODT) 4 MG disintegrating tablet 4mg  ODT q4 hours prn nausea/vomit 10/19/23  Yes Brandon Plan, DO  clonazePAM (KLONOPIN) 1 MG tablet Take 1 tablet (1 mg total) by mouth 2 (two) times daily. 07/07/19   Maczis, Brandon Sow, PA-C  clotrimazole (LOTRIMIN) 1 % cream Apply to affected area 2 times daily 09/14/23   Cardama, Brandon Garnet, MD  ibuprofen (ADVIL) 800 MG tablet Take 1 tablet (800 mg total) by mouth every 8 (eight) hours as needed for moderate pain. 12/14/20   Long, Brandon Repress, MD  methocarbamol (ROBAXIN) 500 MG tablet Take 1 tablet (500 mg total) by mouth every 8 (eight) hours as needed for muscle spasms. 07/07/19   Maczis, Brandon Sow, PA-C  omeprazole (PRILOSEC) 40 MG capsule Take 1 capsule (40 mg total) by mouth daily. 02/09/15   Ward, Brandon Maw, DO  oxyCODONE (OXY IR/ROXICODONE) 5 MG immediate release tablet Take 1 tablet (5 mg total) by mouth every 6 (six) hours as needed for breakthrough pain. 07/07/19   Maczis, Brandon Sow, PA-C  promethazine (PHENERGAN) 25 MG tablet Take 1 tablet (25 mg total) by mouth every 6  (six) hours as needed for nausea or vomiting. 02/09/15   Ward, Brandon Maw, DO  sucralfate (CARAFATE) 1 G tablet Take 1 tablet (1 g total) by mouth 4 (four) times daily -  with meals and at bedtime. 02/09/15   Ward, Brandon Maw, DO      Allergies    Amoxicillin and Penicillins    Review of Systems   Review of Systems  Physical Exam Updated Vital Signs BP (!) 144/94 (BP Location: Right Arm)   Pulse 88   Temp 98.3 F (36.8 C) (Oral)   Resp 16   SpO2 100%  Physical Exam Vitals and nursing note reviewed.  Constitutional:      Appearance: He is well-developed.  HENT:     Head: Normocephalic and atraumatic.     Nose: Congestion present.     Mouth/Throat:     Comments: Swollen turbinates posterior nasal drip Eyes:     Pupils: Pupils are equal, round, and reactive to light.  Neck:     Vascular: No JVD.  Cardiovascular:     Rate and Rhythm: Normal rate and regular rhythm.     Heart sounds: No murmur heard.    No friction rub. No gallop.  Pulmonary:     Effort: No respiratory distress.     Breath sounds: No wheezing.  Abdominal:  General: There is no distension.     Tenderness: There is no abdominal tenderness. There is no guarding or rebound.  Musculoskeletal:        General: Normal range of motion.     Cervical back: Normal range of motion and neck supple.  Skin:    Coloration: Skin is not pale.     Findings: No rash.  Neurological:     Mental Status: He is alert and oriented to person, place, and time.  Psychiatric:        Behavior: Behavior normal.     ED Results / Procedures / Treatments   Labs (all labs ordered are listed, but only abnormal results are displayed) Labs Reviewed  RESP PANEL BY RT-PCR (RSV, FLU A&B, COVID)  [id number]    EKG EKG Interpretation Date/Time:  Monday October 19 2023 02:41:36 EST Ventricular Rate:  80 PR Interval:  146 QRS Duration:  76 QT Interval:  420 QTC Calculation: 485 R Axis:   71  Text Interpretation: Sinus rhythm Probable  left atrial enlargement Left ventricular hypertrophy Borderline prolonged QT interval No significant change since last tracing Confirmed by Brandon Flowers [phone]) on 10/19/2023 3:02:52 AM  Radiology No results found.  Procedures Procedures    Medications Ordered in ED Medications  fluconazole (DIFLUCAN) tablet 150 mg (has no administration in time range)    ED Course/ Medical Decision Making/ A&P                                 Medical Decision Making Risk Prescription drug management.   48 yo M with a chief complaints of cough congestion and foot pain and redness.  The patient had been seen about a month ago for foot pain.  At that time there was some consideration for frostbite versus tinea pedis.  I do think it is more likely to be tinea.  Will give an oral dose of medication here.  I think will be hard for treatment to be successful with him being homeless and not having the ability to perform good hygiene.  Patient also complaining of URI symptoms.  Clear lung sounds for me.  Do not feel he benefit from chest imaging.  Will treat symptomatically.  PCP follow-up.  3:05 AM:  I have discussed the diagnosis/risks/treatment options with the patient.  Evaluation and diagnostic testing in the emergency department does not suggest an emergent condition requiring admission or immediate intervention beyond what has been performed at this time.  They will follow up with PCP. We also discussed returning to the ED immediately if new or worsening sx occur. We discussed the sx which are most concerning (e.g., sudden worsening pain, fever, inability to tolerate by mouth) that necessitate immediate return. Medications administered to the patient during their visit and any new prescriptions provided to the patient are listed below.  Medications given during this visit Medications  fluconazole (DIFLUCAN) tablet 150 mg (has no administration in time range)     The patient appears reasonably screen  and/or stabilized for discharge and I doubt any other medical condition or other [facility] requiring further screening, evaluation, or treatment in the ED at this time prior to discharge.          Final Clinical Impression(s) / ED Diagnoses Final diagnoses:  Foot pain, bilateral  Viral URI with cough    Rx / DC Orders ED Discharge Orders  Ordered    benzonatate (TESSALON) 100 MG capsule  Every 8 hours        10/19/23 0302    ondansetron (ZOFRAN-ODT) 4 MG disintegrating tablet        10/19/23 0302              Brandon Plan, DO 10/19/23 [phone]

## 2023-10-25 ENCOUNTER — Encounter (HOSPITAL_COMMUNITY): Payer: Self-pay | Admitting: Emergency Medicine

## 2023-10-25 ENCOUNTER — Emergency Department (HOSPITAL_COMMUNITY)
Admission: EM | Admit: 2023-10-25 | Discharge: 2023-10-25 | Disposition: A | Discharge status: Home / Self Care | Attending: Emergency Medicine | Admitting: Emergency Medicine

## 2023-10-25 ENCOUNTER — Other Ambulatory Visit: Payer: Self-pay

## 2023-10-25 DIAGNOSIS — S90821A Blister (nonthermal), right foot, initial encounter: Secondary | ICD-10-CM | POA: Diagnosis not present

## 2023-10-25 DIAGNOSIS — T148XXA Other injury of unspecified body region, initial encounter: Secondary | ICD-10-CM

## 2023-10-25 DIAGNOSIS — S90822A Blister (nonthermal), left foot, initial encounter: Secondary | ICD-10-CM | POA: Diagnosis present

## 2023-10-25 DIAGNOSIS — M79671 Pain in right foot: Secondary | ICD-10-CM

## 2023-10-25 DIAGNOSIS — Z5902 Unsheltered homelessness: Secondary | ICD-10-CM | POA: Insufficient documentation

## 2023-10-25 DIAGNOSIS — X31XXXA Exposure to excessive natural cold, initial encounter: Secondary | ICD-10-CM | POA: Insufficient documentation

## 2023-10-25 MED ORDER — HYDROCODONE-ACETAMINOPHEN 5-325 MG PO TABS
1.0000 | ORAL_TABLET | Freq: Once | ORAL | Status: AC
  Administered 2023-10-25: 1 via ORAL
  Filled 2023-10-25: qty 1

## 2023-10-25 MED ORDER — NAPROXEN 375 MG PO TABS: 375.0000 mg | ORAL_TABLET | Freq: Two times a day (BID) | ORAL | 0 refills | Status: DC

## 2023-10-25 NOTE — ED Triage Notes (Signed)
 Pt has been having foot pain around a month. Pts toes are red and warm to touch with large blister under big toe. Pt states pain is radiating up to ankle. Pt also c/o N/V and some diarrhea.

## 2023-10-25 NOTE — Discharge Instructions (Addendum)
 Get help right away if: You have pus coming from your cold exposed area. You have severe peeling on your cold exposed area. Your cold exposed area turns a dark color. You have a red streak spreading away from your cold exposed area.
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
## 2024-02-04 ENCOUNTER — Encounter: Payer: Self-pay | Admitting: Family Medicine

## 2024-03-16 ENCOUNTER — Inpatient Hospital Stay (HOSPITAL_COMMUNITY)
Admission: EM | Admit: 2024-03-16 | Discharge: 2024-03-20 | DRG: 683 | Disposition: A | Discharge status: Home / Self Care | Attending: Family Medicine | Admitting: Family Medicine

## 2024-03-16 ENCOUNTER — Emergency Department (HOSPITAL_COMMUNITY)

## 2024-03-16 DIAGNOSIS — N179 Acute kidney failure, unspecified: Principal | ICD-10-CM | POA: Diagnosis present

## 2024-03-16 DIAGNOSIS — R112 Nausea with vomiting, unspecified: Secondary | ICD-10-CM | POA: Diagnosis present

## 2024-03-16 DIAGNOSIS — Z1152 Encounter for screening for COVID-19: Secondary | ICD-10-CM

## 2024-03-16 DIAGNOSIS — Z7409 Other reduced mobility: Secondary | ICD-10-CM | POA: Diagnosis present

## 2024-03-16 DIAGNOSIS — R7989 Other specified abnormal findings of blood chemistry: Secondary | ICD-10-CM | POA: Diagnosis present

## 2024-03-16 DIAGNOSIS — E860 Dehydration: Secondary | ICD-10-CM | POA: Diagnosis present

## 2024-03-16 DIAGNOSIS — D72829 Elevated white blood cell count, unspecified: Secondary | ICD-10-CM | POA: Diagnosis present

## 2024-03-16 DIAGNOSIS — Z789 Other specified health status: Secondary | ICD-10-CM | POA: Diagnosis present

## 2024-03-16 DIAGNOSIS — Z5900 Homelessness unspecified: Secondary | ICD-10-CM

## 2024-03-16 DIAGNOSIS — E43 Unspecified severe protein-calorie malnutrition: Secondary | ICD-10-CM | POA: Diagnosis present

## 2024-03-16 DIAGNOSIS — Z681 Body mass index (BMI) 19 or less, adult: Secondary | ICD-10-CM

## 2024-03-16 DIAGNOSIS — B1920 Unspecified viral hepatitis C without hepatic coma: Secondary | ICD-10-CM | POA: Diagnosis present

## 2024-03-16 DIAGNOSIS — E871 Hypo-osmolality and hyponatremia: Secondary | ICD-10-CM | POA: Diagnosis present

## 2024-03-16 DIAGNOSIS — F17210 Nicotine dependence, cigarettes, uncomplicated: Secondary | ICD-10-CM | POA: Diagnosis present

## 2024-03-16 DIAGNOSIS — F1010 Alcohol abuse, uncomplicated: Secondary | ICD-10-CM | POA: Diagnosis present

## 2024-03-16 LAB — CBC WITH DIFFERENTIAL/PLATELET
Abs Immature Granulocytes: 0.12 K/uL — ABNORMAL HIGH (ref 0.00–0.07)
Basophils Absolute: 0 K/uL (ref 0.0–0.1)
Basophils Relative: 0 %
Eosinophils Absolute: 0 K/uL (ref 0.0–0.5)
Eosinophils Relative: 0 %
HCT: 44.5 % (ref 39.0–52.0)
Hemoglobin: 14.8 g/dL (ref 13.0–17.0)
Immature Granulocytes: 1 %
Lymphocytes Relative: 8 %
Lymphs Abs: 1.5 K/uL (ref 0.7–4.0)
MCH: 30.8 pg (ref 26.0–34.0)
MCHC: 33.3 g/dL (ref 30.0–36.0)
MCV: 92.7 fL (ref 80.0–100.0)
Monocytes Absolute: 0.9 K/uL (ref 0.1–1.0)
Monocytes Relative: 5 %
Neutro Abs: 15.2 K/uL — ABNORMAL HIGH (ref 1.7–7.7)
Neutrophils Relative %: 86 %
Platelets: 291 K/uL (ref 150–400)
RBC: 4.8 MIL/uL (ref 4.22–5.81)
RDW: 13.6 % (ref 11.5–15.5)
WBC: 17.8 K/uL — ABNORMAL HIGH (ref 4.0–10.5)
nRBC: 0 % (ref 0.0–0.2)

## 2024-03-16 LAB — COMPREHENSIVE METABOLIC PANEL WITH GFR
ALT: 260 U/L — ABNORMAL HIGH (ref 0–44)
AST: 131 U/L — ABNORMAL HIGH (ref 15–41)
Albumin: 5.1 g/dL — ABNORMAL HIGH (ref 3.5–5.0)
Alkaline Phosphatase: 157 U/L — ABNORMAL HIGH (ref 38–126)
Anion gap: 19 — ABNORMAL HIGH (ref 5–15)
BUN: 58 mg/dL — ABNORMAL HIGH (ref 6–20)
CO2: 18 mmol/L — ABNORMAL LOW (ref 22–32)
Calcium: 9.7 mg/dL (ref 8.9–10.3)
Chloride: 96 mmol/L — ABNORMAL LOW (ref 98–111)
Creatinine, Ser: 3.16 mg/dL — ABNORMAL HIGH (ref 0.61–1.24)
GFR, Estimated: 23 mL/min — ABNORMAL LOW (ref >60)
Glucose, Bld: 90 mg/dL (ref 70–99)
Potassium: 4.4 mmol/L (ref 3.5–5.1)
Sodium: 133 mmol/L — ABNORMAL LOW (ref 135–145)
Total Bilirubin: 1.9 mg/dL — ABNORMAL HIGH (ref 0.0–1.2)
Total Protein: 10.2 g/dL — ABNORMAL HIGH (ref 6.5–8.1)

## 2024-03-16 LAB — LIPASE, BLOOD: Lipase: 24 U/L (ref 11–51)

## 2024-03-16 LAB — ETHANOL: Alcohol, Ethyl (B): <15 mg/dL (ref <15)

## 2024-03-16 MED ORDER — SODIUM CHLORIDE 0.9 % IV SOLN
2.0000 g | Freq: Once | INTRAVENOUS | Status: AC
  Administered 2024-03-17: 2 g via INTRAVENOUS
  Filled 2024-03-16: qty 20

## 2024-03-16 MED ORDER — LACTATED RINGERS IV BOLUS
1000.0000 mL | Freq: Once | INTRAVENOUS | Status: AC
  Administered 2024-03-16: 1000 mL via INTRAVENOUS

## 2024-03-16 MED ORDER — HYOSCYAMINE SULFATE 0.125 MG SL SUBL
0.2500 mg | SUBLINGUAL_TABLET | Freq: Once | SUBLINGUAL | Status: AC
  Administered 2024-03-16: 0.25 mg via SUBLINGUAL
  Filled 2024-03-16: qty 2

## 2024-03-16 MED ORDER — LACTATED RINGERS IV BOLUS
1000.0000 mL | Freq: Once | INTRAVENOUS | Status: AC
  Administered 2024-03-17: 1000 mL via INTRAVENOUS

## 2024-03-16 MED ORDER — METRONIDAZOLE 500 MG/100ML IV SOLN
500.0000 mg | Freq: Once | INTRAVENOUS | Status: AC
  Administered 2024-03-17: 500 mg via INTRAVENOUS
  Filled 2024-03-16: qty 100

## 2024-03-16 MED ORDER — ONDANSETRON HCL 4 MG/2ML IJ SOLN
4.0000 mg | Freq: Once | INTRAMUSCULAR | Status: AC
  Administered 2024-03-16: 4 mg via INTRAVENOUS
  Filled 2024-03-16: qty 2
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
## 2024-03-17 ENCOUNTER — Emergency Department (HOSPITAL_COMMUNITY)

## 2024-03-17 ENCOUNTER — Other Ambulatory Visit: Payer: Self-pay

## 2024-03-17 ENCOUNTER — Encounter (HOSPITAL_COMMUNITY): Payer: Self-pay | Admitting: Internal Medicine

## 2024-03-17 DIAGNOSIS — R112 Nausea with vomiting, unspecified: Secondary | ICD-10-CM | POA: Diagnosis not present

## 2024-03-17 DIAGNOSIS — F17210 Nicotine dependence, cigarettes, uncomplicated: Secondary | ICD-10-CM | POA: Diagnosis present

## 2024-03-17 DIAGNOSIS — Z1152 Encounter for screening for COVID-19: Secondary | ICD-10-CM | POA: Diagnosis not present

## 2024-03-17 DIAGNOSIS — E860 Dehydration: Secondary | ICD-10-CM | POA: Diagnosis present

## 2024-03-17 DIAGNOSIS — Z681 Body mass index (BMI) 19 or less, adult: Secondary | ICD-10-CM | POA: Diagnosis not present

## 2024-03-17 DIAGNOSIS — D72829 Elevated white blood cell count, unspecified: Secondary | ICD-10-CM | POA: Diagnosis present

## 2024-03-17 DIAGNOSIS — R7989 Other specified abnormal findings of blood chemistry: Secondary | ICD-10-CM | POA: Diagnosis not present

## 2024-03-17 DIAGNOSIS — Z7409 Other reduced mobility: Secondary | ICD-10-CM

## 2024-03-17 DIAGNOSIS — Z789 Other specified health status: Secondary | ICD-10-CM

## 2024-03-17 DIAGNOSIS — E43 Unspecified severe protein-calorie malnutrition: Secondary | ICD-10-CM | POA: Diagnosis present

## 2024-03-17 DIAGNOSIS — N179 Acute kidney failure, unspecified: Principal | ICD-10-CM | POA: Diagnosis present

## 2024-03-17 DIAGNOSIS — Z5900 Homelessness unspecified: Secondary | ICD-10-CM

## 2024-03-17 DIAGNOSIS — E871 Hypo-osmolality and hyponatremia: Secondary | ICD-10-CM | POA: Diagnosis present

## 2024-03-17 DIAGNOSIS — R197 Diarrhea, unspecified: Secondary | ICD-10-CM

## 2024-03-17 DIAGNOSIS — F1010 Alcohol abuse, uncomplicated: Secondary | ICD-10-CM | POA: Diagnosis present

## 2024-03-17 DIAGNOSIS — B1920 Unspecified viral hepatitis C without hepatic coma: Secondary | ICD-10-CM | POA: Diagnosis present

## 2024-03-17 LAB — COMPREHENSIVE METABOLIC PANEL WITH GFR
ALT: 176 U/L — ABNORMAL HIGH (ref 0–44)
ALT: 157 U/L — ABNORMAL HIGH (ref 0–44)
AST: 85 U/L — ABNORMAL HIGH (ref 15–41)
AST: 86 U/L — ABNORMAL HIGH (ref 15–41)
Albumin: 3.7 g/dL (ref 3.5–5.0)
Albumin: 3.5 g/dL (ref 3.5–5.0)
Alkaline Phosphatase: 122 U/L (ref 38–126)
Alkaline Phosphatase: 111 U/L (ref 38–126)
Anion gap: 13 (ref 5–15)
Anion gap: 7 (ref 5–15)
BUN: 50 mg/dL — ABNORMAL HIGH (ref 6–20)
BUN: 39 mg/dL — ABNORMAL HIGH (ref 6–20)
CO2: 19 mmol/L — ABNORMAL LOW (ref 22–32)
CO2: 25 mmol/L (ref 22–32)
Calcium: 8.4 mg/dL — ABNORMAL LOW (ref 8.9–10.3)
Calcium: 8.8 mg/dL — ABNORMAL LOW (ref 8.9–10.3)
Chloride: 100 mmol/L (ref 98–111)
Chloride: 102 mmol/L (ref 98–111)
Creatinine, Ser: 1.64 mg/dL — ABNORMAL HIGH (ref 0.61–1.24)
Creatinine, Ser: 1.33 mg/dL — ABNORMAL HIGH (ref 0.61–1.24)
GFR, Estimated: 52 mL/min — ABNORMAL LOW (ref >60)
GFR, Estimated: >60 mL/min (ref >60)
Glucose, Bld: 106 mg/dL — ABNORMAL HIGH (ref 70–99)
Glucose, Bld: 90 mg/dL (ref 70–99)
Potassium: 3.9 mmol/L (ref 3.5–5.1)
Potassium: 4.6 mmol/L (ref 3.5–5.1)
Sodium: 132 mmol/L — ABNORMAL LOW (ref 135–145)
Sodium: 134 mmol/L — ABNORMAL LOW (ref 135–145)
Total Bilirubin: 1.2 mg/dL (ref 0.0–1.2)
Total Bilirubin: 1.2 mg/dL (ref 0.0–1.2)
Total Protein: 7.8 g/dL (ref 6.5–8.1)
Total Protein: 7.3 g/dL (ref 6.5–8.1)

## 2024-03-17 LAB — CREATININE, URINE, RANDOM: Creatinine, Urine: 113 mg/dL

## 2024-03-17 LAB — URINALYSIS, ROUTINE W REFLEX MICROSCOPIC
Bilirubin Urine: NEGATIVE
Glucose, UA: NEGATIVE mg/dL
Ketones, ur: 20 mg/dL — AB
Leukocytes,Ua: NEGATIVE
Nitrite: NEGATIVE
Protein, ur: 100 mg/dL — AB
Specific Gravity, Urine: 1.019 (ref 1.005–1.030)
pH: 5 (ref 5.0–8.0)

## 2024-03-17 LAB — CK: Total CK: 1132 U/L — ABNORMAL HIGH (ref 49–397)

## 2024-03-17 LAB — PROCALCITONIN: Procalcitonin: 2.99 ng/mL

## 2024-03-17 LAB — PREALBUMIN: Prealbumin: 15 mg/dL — ABNORMAL LOW (ref 18–38)

## 2024-03-17 LAB — RAPID URINE DRUG SCREEN, HOSP PERFORMED
Amphetamines: POSITIVE — AB
Barbiturates: NOT DETECTED
Benzodiazepines: NOT DETECTED
Cocaine: POSITIVE — AB
Opiates: NOT DETECTED
Tetrahydrocannabinol: POSITIVE — AB

## 2024-03-17 LAB — OSMOLALITY, URINE: Osmolality, Ur: 671 mosm/kg (ref 300–900)

## 2024-03-17 LAB — CBC
HCT: 37.1 % — ABNORMAL LOW (ref 39.0–52.0)
Hemoglobin: 12.5 g/dL — ABNORMAL LOW (ref 13.0–17.0)
MCH: 31.7 pg (ref 26.0–34.0)
MCHC: 33.7 g/dL (ref 30.0–36.0)
MCV: 94.2 fL (ref 80.0–100.0)
Platelets: 263 K/uL (ref 150–400)
RBC: 3.94 MIL/uL — ABNORMAL LOW (ref 4.22–5.81)
RDW: 13.9 % (ref 11.5–15.5)
WBC: 11.7 K/uL — ABNORMAL HIGH (ref 4.0–10.5)
nRBC: 0 % (ref 0.0–0.2)

## 2024-03-17 LAB — URINALYSIS, COMPLETE (UACMP) WITH MICROSCOPIC
Bacteria, UA: NONE SEEN
Bilirubin Urine: NEGATIVE
Glucose, UA: NEGATIVE mg/dL
Hgb urine dipstick: NEGATIVE
Ketones, ur: 20 mg/dL — AB
Leukocytes,Ua: NEGATIVE
Nitrite: NEGATIVE
Protein, ur: NEGATIVE mg/dL
Specific Gravity, Urine: 1.02 (ref 1.005–1.030)
pH: 5 (ref 5.0–8.0)

## 2024-03-17 LAB — MAGNESIUM
Magnesium: 2.8 mg/dL — ABNORMAL HIGH (ref 1.7–2.4)
Magnesium: 2.5 mg/dL — ABNORMAL HIGH (ref 1.7–2.4)

## 2024-03-17 LAB — AMMONIA: Ammonia: 29 umol/L (ref 9–35)

## 2024-03-17 LAB — OSMOLALITY: Osmolality: 312 mosm/kg — ABNORMAL HIGH (ref 275–295)

## 2024-03-17 LAB — HEPATITIS PANEL, ACUTE
HCV Ab: REACTIVE — AB
Hep A IgM: NONREACTIVE
Hep B C IgM: NONREACTIVE
Hepatitis B Surface Ag: NONREACTIVE

## 2024-03-17 LAB — PHOSPHORUS
Phosphorus: 4.7 mg/dL — ABNORMAL HIGH (ref 2.5–4.6)
Phosphorus: 1.7 mg/dL — ABNORMAL LOW (ref 2.5–4.6)

## 2024-03-17 LAB — SODIUM, URINE, RANDOM: Sodium, Ur: 30 mmol/L

## 2024-03-17 LAB — LACTIC ACID, PLASMA
Lactic Acid, Venous: 1.5 mmol/L (ref 0.5–1.9)
Lactic Acid, Venous: 0.9 mmol/L (ref 0.5–1.9)

## 2024-03-17 LAB — PROTIME-INR
INR: 1.1 (ref 0.8–1.2)
Prothrombin Time: 15 s (ref 11.4–15.2)

## 2024-03-17 MED ORDER — ALBUTEROL SULFATE (2.5 MG/3ML) 0.083% IN NEBU: 2.5000 mg | INHALATION_SOLUTION | RESPIRATORY_TRACT | Status: DC | PRN

## 2024-03-17 MED ORDER — LORAZEPAM 1 MG PO TABS: 1.0000 mg | ORAL_TABLET | ORAL | Status: AC | PRN

## 2024-03-17 MED ORDER — LORAZEPAM 2 MG/ML IJ SOLN: 1.0000 mg | INTRAMUSCULAR | Status: AC | PRN

## 2024-03-17 MED ORDER — ADULT MULTIVITAMIN W/MINERALS CH
1.0000 | ORAL_TABLET | Freq: Every day | ORAL | Status: DC
  Administered 2024-03-17 – 2024-03-20 (×4): 1 via ORAL
  Filled 2024-03-17 (×4): qty 1

## 2024-03-17 MED ORDER — SODIUM CHLORIDE 0.9 % IV SOLN: INTRAVENOUS | Status: AC

## 2024-03-17 MED ORDER — SUCRALFATE 1 G PO TABS
1.0000 g | ORAL_TABLET | Freq: Three times a day (TID) | ORAL | Status: DC
  Administered 2024-03-17 – 2024-03-20 (×11): 1 g via ORAL
  Filled 2024-03-17 (×11): qty 1

## 2024-03-17 MED ORDER — FENTANYL CITRATE PF 50 MCG/ML IJ SOSY: 12.5000 ug | PREFILLED_SYRINGE | INTRAMUSCULAR | Status: DC | PRN

## 2024-03-17 MED ORDER — PANTOPRAZOLE SODIUM 40 MG PO TBEC
40.0000 mg | DELAYED_RELEASE_TABLET | Freq: Every day | ORAL | Status: DC
  Administered 2024-03-18 – 2024-03-20 (×3): 40 mg via ORAL
  Filled 2024-03-17 (×3): qty 1

## 2024-03-17 MED ORDER — ACETAMINOPHEN 325 MG PO TABS: 650.0000 mg | ORAL_TABLET | Freq: Four times a day (QID) | ORAL | Status: DC | PRN

## 2024-03-17 MED ORDER — FOLIC ACID 1 MG PO TABS
1.0000 mg | ORAL_TABLET | Freq: Every day | ORAL | Status: DC
  Administered 2024-03-17 – 2024-03-20 (×4): 1 mg via ORAL
  Filled 2024-03-17 (×4): qty 1

## 2024-03-17 MED ORDER — ACETAMINOPHEN 650 MG RE SUPP
650.0000 mg | Freq: Four times a day (QID) | RECTAL | Status: DC | PRN
Start: 2024-03-17 — End: 2024-03-20

## 2024-03-17 MED ORDER — LACTATED RINGERS IV SOLN: INTRAVENOUS | Status: AC

## 2024-03-17 MED ORDER — THIAMINE MONONITRATE 100 MG PO TABS
100.0000 mg | ORAL_TABLET | Freq: Every day | ORAL | Status: DC
  Administered 2024-03-17 – 2024-03-20 (×4): 100 mg via ORAL
  Filled 2024-03-17 (×4): qty 1

## 2024-03-17 MED ORDER — ONDANSETRON HCL 4 MG PO TABS
4.0000 mg | ORAL_TABLET | Freq: Four times a day (QID) | ORAL | Status: DC | PRN
  Administered 2024-03-18: 4 mg via ORAL
  Filled 2024-03-17: qty 1

## 2024-03-17 MED ORDER — HYDROCODONE-ACETAMINOPHEN 5-325 MG PO TABS
1.0000 | ORAL_TABLET | ORAL | Status: DC | PRN
  Administered 2024-03-17 – 2024-03-20 (×9): 2 via ORAL
  Filled 2024-03-17 (×9): qty 2

## 2024-03-17 MED ORDER — ONDANSETRON HCL 4 MG/2ML IJ SOLN: 4.0000 mg | Freq: Four times a day (QID) | INTRAMUSCULAR | Status: DC | PRN

## 2024-03-17 MED ORDER — THIAMINE HCL 100 MG/ML IJ SOLN
100.0000 mg | Freq: Every day | INTRAMUSCULAR | Status: DC
  Filled 2024-03-17: qty 2

## 2024-03-17 MED ORDER — THIAMINE HCL 100 MG/ML IJ SOLN
100.0000 mg | Freq: Once | INTRAMUSCULAR | Status: AC
  Administered 2024-03-17: 100 mg via INTRAVENOUS
  Filled 2024-03-17: qty 2

## 2024-03-17 NOTE — Assessment & Plan Note (Signed)
 Will do pt /ot eval

## 2024-03-17 NOTE — Assessment & Plan Note (Signed)
 In the setting of significant dehydration and hemoconcentration at this point no evidence of infectious process will continue to monitor

## 2024-03-17 NOTE — Assessment & Plan Note (Addendum)
 Monitor for any sign of alcohol withdrawal I will write for CIWA protocol Pt states has been off EtOH  But has been using occasional benzodiazepines Given unclear hx of risk for withdraw will monitor

## 2024-03-17 NOTE — Assessment & Plan Note (Signed)
-    evidence of acute renal failure due to presence of following: Cr increased >0.3 from baseline  likely secondary to dehydration,        check FeNA       Rehydrate with IV fluids History does not suggest urinary retention or obstruction         If persists despite fluid resuscitation will need renal consult.

## 2024-03-17 NOTE — Plan of Care (Signed)
 New admission

## 2024-03-17 NOTE — Assessment & Plan Note (Addendum)
 In the setting of significant dehydration history of alcohol abuse.  Will check CK noncontrasted CT abdomen unremarkable.  Will rehydrate and repeat obtain hepatitis serologies States has hx of HEp C
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
## 2024-03-17 NOTE — Assessment & Plan Note (Signed)
Transitional care consult
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
## 2024-03-17 NOTE — H&P (Signed)
 Brandon Flowers:[id number] DOB: [birth year] DOA: 03/16/2024     PCP: Patient, No Pcp Per    Patient arrived to ER on 03/16/24 at 2028 Referred by Attending Ula Prentice SAUNDERS, MD   Patient coming from:  homeless    Chief Complaint: n/v   HPI: Brandon Flowers is a 48 y.o. male with medical history significant of homelessness, etoh abuse in the past and HEpC    Presented with   N/V/D Came in with epigastric pain  N/V .  Yesterday he took what he thought was Xanax from his friend after taking 2 doses he started to have profuse nausea and vomiting as well as some diarrhea [place] he felt very weak and fell into the water patient feels like he may have drank some of the dirty water from the [facility] In the ER found to have creatinine of 3.16 elevated LFTs Elevated white blood cell count up to 18 CT abdomen pelvis and head unremarkable     Denies significant ETOH intake  says been sober 6 years Does not smoke    Occasional marijuana use     UTOX pos for cocaine and amphetamines  While in ER:     Found to have leukocytosis and AKI    Lab Orders         Blood culture (routine x 2)         CBC with Differential         Comprehensive metabolic panel         Lipase, blood         Urinalysis, Routine w reflex microscopic -Urine, Clean Catch         Rapid urine drug screen (hospital performed)         Ethanol         Ammonia         Lactic acid, plasma      CT HEAD  NON acute  CXR - ordered  CTabd/pelvis - non-acute  Following Medications were ordered in ER: Medications  cefTRIAXone  (ROCEPHIN ) 2 g in sodium chloride  0.9 % 100 mL IVPB (2 g Intravenous New Bag/Given 03/17/24 0023)  metroNIDAZOLE  (FLAGYL ) IVPB 500 mg (500 mg Intravenous New Bag/Given 03/17/24 0023)  lactated ringers  bolus 1,000 mL (0 mLs Intravenous Stopped 03/17/24 0011)  ondansetron  (ZOFRAN ) injection 4 mg (4 mg Intravenous Given 03/16/24 2219)  hyoscyamine  (LEVSIN  SL) SL tablet 0.25 mg  (0.25 mg Sublingual Given 03/16/24 2219)  lactated ringers  bolus 1,000 mL (1,000 mLs Intravenous New Bag/Given 03/17/24 0023)      ED Triage Vitals [03/16/24 2058]  Encounter Vitals Group     BP 127/87     Girls Systolic BP Percentile      Girls Diastolic BP Percentile      Boys Systolic BP Percentile      Boys Diastolic BP Percentile      Pulse Rate 88     Resp 20     Temp 97.8 F (36.6 C)     Temp Source Oral     SpO2 95 %     Weight      Height      Head Circumference      Peak Flow      Pain Score      Pain Loc      Pain Education      Exclude from Growth Chart   UFJK(75)@     _________________________________________ Significant initial  Findings:  Abnormal Labs Reviewed  CBC WITH DIFFERENTIAL/PLATELET - Abnormal; Notable for the following components:      Result Value   WBC 17.8 (*)    Neutro Abs 15.2 (*)    Abs Immature Granulocytes 0.12 (*)    All other components within normal limits  COMPREHENSIVE METABOLIC PANEL WITH GFR - Abnormal; Notable for the following components:   Sodium 133 (*)    Chloride 96 (*)    CO2 18 (*)    BUN 58 (*)    Creatinine, Ser 3.16 (*)    Total Protein 10.2 (*)    Albumin  5.1 (*)    AST 131 (*)    ALT 260 (*)    Alkaline Phosphatase 157 (*)    Total Bilirubin 1.9 (*)    GFR, Estimated 23 (*)    Anion gap 19 (*)    All other components within normal limits      ECG: Ordered   BNP (last 3 results) No results for input(s): BNP in the last 8760 hours.    No results for input(s): DDIMER, FERRITIN, LDH, CRP in the last 72 hours.      The recent clinical data is shown below. Vitals:   03/16/24 2058  BP: 127/87  Pulse: 88  Resp: 20  Temp: 97.8 F (36.6 C)  TempSrc: Oral  SpO2: 95%     WBC     Component Value Date/Time   WBC 17.8 (H) 03/16/2024 2232   LYMPHSABS 1.5 03/16/2024 2232   MONOABS 0.9 03/16/2024 2232   EOSABS 0.0 03/16/2024 2232   BASOSABS 0.0 03/16/2024 2232      Procalcitonin   Ordered       UA  ordered    Results for orders placed or performed during the hospital encounter of 06/22/19  SARS CORONAVIRUS 2 (TAT 6-24 HRS) Nasopharyngeal Nasopharyngeal Swab     Status: None   Collection Time: 06/22/19  7:44 PM   Specimen: Nasopharyngeal Swab  Result Value Ref Range Status   SARS Coronavirus 2 NEGATIVE NEGATIVE Final    Comment: (NOTE) SARS-CoV-2 target nucleic acids are NOT DETECTED. The SARS-CoV-2 RNA is generally detectable in upper and lower respiratory specimens during the acute phase of infection. Negative results do not preclude SARS-CoV-2 infection, do not rule out co-infections with other pathogens, and should not be used as the sole basis for treatment or other patient management decisions. Negative results must be combined with clinical observations, patient history, and epidemiological information. The expected result is Negative. Fact Sheet for Patients: HairSlick.no Fact Sheet for Healthcare Providers: quierodirigir.com This test is not yet approved or cleared by the [place]  FDA and  has been authorized for detection and/or diagnosis of SARS-CoV-2 by FDA under an Emergency Use Authorization (EUA). This EUA will remain  in effect (meaning this test can be used) for the duration of the COVID-19 declaration under Section 56 4(b)(1) of the Act, 21 U.S.C. section 360bbb-3(b)(1), unless the authorization is terminated or revoked sooner. Performed at [facility], [address] [place], [place] [address]   MRSA PCR Screening     Status: None   Collection Time: 06/22/19 11:59 PM   Specimen: Nasal Mucosa; Nasopharyngeal  Result Value Ref Range Status   MRSA by PCR NEGATIVE NEGATIVE Final    Comment:        The GeneXpert MRSA Assay (FDA approved for NASAL specimens only), is one component of a comprehensive MRSA colonization surveillance program. It is not intended to diagnose  MRSA infection nor to guide or monitor treatment for MRSA infections. Performed at [facility], [address], [place], [place] [address]   Culture, respiratory (non-expectorated)     Status: None   Collection Time: 06/26/19  8:58 AM   Specimen: Tracheal Aspirate; Respiratory  Result Value Ref Range Status   Specimen Description TRACHEAL ASPIRATE  Final   Special Requests Normal  Final   Gram Stain   Final    RARE WBC PRESENT,BOTH PMN AND MONONUCLEAR MODERATE GRAM NEGATIVE COCCOBACILLI FEW GRAM POSITIVE COCCI IN CLUSTERS    Culture   Final    ABUNDANT STAPHYLOCOCCUS AUREUS ABUNDANT HAEMOPHILUS INFLUENZAE BETA LACTAMASE POSITIVE See Scanned report in [place] Link for susceptibilities on DOS 06/26/19. Performed at [facility] Performed at [facility], [address], [place], [place] [address]    Report Status 07/04/2019 FINAL  Final   Organism ID, Bacteria STAPHYLOCOCCUS AUREUS  Final      Susceptibility   Staphylococcus aureus - MIC*    CIPROFLOXACIN  <=0.5 SENSITIVE Sensitive     ERYTHROMYCIN 0.5 SENSITIVE Sensitive     GENTAMICIN <=0.5 SENSITIVE Sensitive     OXACILLIN 0.5 SENSITIVE Sensitive     TETRACYCLINE <=1 SENSITIVE Sensitive     VANCOMYCIN  <=0.5 SENSITIVE Sensitive     TRIMETH/SULFA <=10 SENSITIVE Sensitive     CLINDAMYCIN <=0.25 SENSITIVE Sensitive     RIFAMPIN <=0.5 SENSITIVE Sensitive     Inducible Clindamycin NEGATIVE Sensitive     * ABUNDANT STAPHYLOCOCCUS AUREUS  Susceptibility, Aer + Anaerob     Status: Abnormal   Collection Time: 06/26/19  8:58 AM  Result Value Ref Range Status   Suscept, Aer + Anaerob REFERT (A)  Corrected    Comment: (NOTE) Please refer to the following specimen for additional lab results. Please refer to spec # [id number] for test result. [employer] notified 06-30-2019 at 08:45 Performed At: [facility] [address] [place], [place] [id number] Jennette Shorter MD Ey:[id number] CORRECTED ON  11/12 AT 9062: PREVIOUSLY REPORTED AS Preliminary report    Source of Sample HAEMOPHILUS INFLUENZAE FOR SUSCEPTIBILITY  Final    Comment: Performed at [facility], [address] [place], [place] [address]  Susceptibility Result     Status: Abnormal   Collection Time: 06/26/19  8:58 AM  Result Value Ref Range Status   Suscept Result 1 Comment (A)  Final    Comment: (NOTE) Haemophilus influenzae Identification performed by account, not confirmed by this laboratory. Performed At: [facility] [address] [place], [place] [id number] Jennette Shorter MD Ey:[id number]     ABX given Antibiotics Given (last 72 hours)     Date/Time Action Medication Dose Rate   03/17/24 0023 New Bag/Given   cefTRIAXone  (ROCEPHIN ) 2 g in sodium chloride  0.9 % 100 mL IVPB 2 g 200 mL/hr   03/17/24 0023 New Bag/Given   metroNIDAZOLE  (FLAGYL ) IVPB 500 mg 500 mg 100 mL/hr        No results found for the last 90 days.     __________________________________________________________ Recent Labs  Lab 03/16/24 2232  NA 133*  K 4.4  CO2 18*  GLUCOSE 90  BUN 58*  CREATININE 3.16*  CALCIUM 9.7    Cr   Up from baseline see below Lab Results  Component Value Date   CREATININE 3.16 (H) 03/16/2024   CREATININE 0.87 04/21/2022   CREATININE 0.96 12/14/2020    Recent Labs  Lab 03/16/24 2232  AST 131*  ALT 260*  ALKPHOS 157*  BILITOT 1.9*  PROT 10.2*  ALBUMIN  5.1*   Lab Results  Component Value Date   CALCIUM 9.7 03/16/2024   PHOS 3.2 07/07/2019       Plt: Lab Results  Component Value Date   PLT 291 03/16/2024         Recent Labs  Lab 03/16/24 2232  WBC 17.8*  NEUTROABS 15.2*  HGB 14.8  HCT 44.5  MCV 92.7  PLT 291    HG/HCT  stable,      Component Value Date/Time   HGB 14.8 03/16/2024 2232   HCT 44.5 03/16/2024 2232   MCV 92.7 03/16/2024 2232     Recent Labs  Lab 03/16/24 2232  LIPASE 24   No results for input(s): AMMONIA in the last 168  hours.    _______________________________________________ Hospitalist was called for admission for   AKI   Nausea and vomiting, unspecified vomiting type    The following Work up has been ordered so far:  Orders Placed This Encounter  Procedures   Blood culture (routine x 2)   CT Head Wo Contrast   CT ABDOMEN PELVIS WO CONTRAST   CBC with Differential   Comprehensive metabolic panel   Lipase, blood   Urinalysis, Routine w reflex microscopic -Urine, Clean Catch   Rapid urine drug screen (hospital performed)   Ethanol   Ammonia   Lactic acid, plasma   Consult to hospitalist   Insert peripheral IV     OTHER Significant initial  Findings:  labs showing:     DM  labs:  HbA1C: No results for input(s): HGBA1C in the last 8760 hours.     CBG (last 3)  No results for input(s): GLUCAP in the last 72 hours.        Cultures:    Component Value Date/Time   SDES TRACHEAL ASPIRATE 06/26/2019 0858   SPECREQUEST Normal 06/26/2019 0858   CULT  06/26/2019 0858    ABUNDANT STAPHYLOCOCCUS AUREUS ABUNDANT HAEMOPHILUS INFLUENZAE BETA LACTAMASE POSITIVE See Scanned report in [place] Link for susceptibilities on DOS 06/26/19. Performed at [employer] [facility] Performed at [facility], [address] [place], [place] [address]    REPTSTATUS 07/04/2019 FINAL 06/26/2019 0858     Radiological Exams on Admission: CT ABDOMEN PELVIS WO CONTRAST Result Date: 03/17/2024 CLINICAL DATA:  Abdominal pain EXAM: CT ABDOMEN AND PELVIS WITHOUT CONTRAST TECHNIQUE: Multidetector CT imaging of the abdomen and pelvis was performed following the standard protocol without IV contrast. RADIATION DOSE REDUCTION: This exam was performed according to the departmental dose-optimization program which includes automated exposure control, adjustment of the mA and/or kV according to patient size and/or use of iterative reconstruction technique. COMPARISON:  None Available. FINDINGS: Lower chest: No acute  abnormality Hepatobiliary: No focal hepatic abnormality. Gallbladder unremarkable. Pancreas: No focal abnormality or ductal dilatation. Spleen: No focal abnormality.  Normal size. Adrenals/Urinary Tract: No adrenal abnormality. No focal renal abnormality. No stones or hydronephrosis. Urinary bladder is unremarkable. Stomach/Bowel: Stomach, large and small bowel grossly unremarkable. Normal appendix Vascular/Lymphatic: No evidence of aneurysm or adenopathy. Reproductive: No visible focal abnormality. Other: No free fluid or free air. Musculoskeletal: No acute bony abnormality. IMPRESSION: No acute findings in the abdomen or pelvis. Electronically Signed   By: Franky Crease M.D.   On: 03/17/2024 00:22   CT Head Wo Contrast Result Date: 03/17/2024 CLINICAL DATA:  Headache EXAM: CT HEAD WITHOUT CONTRAST TECHNIQUE: Contiguous axial images were obtained from the base of the skull through the vertex  without intravenous contrast. RADIATION DOSE REDUCTION: This exam was performed according to the departmental dose-optimization program which includes automated exposure control, adjustment of the mA and/or kV according to patient size and/or use of iterative reconstruction technique. COMPARISON:  04/21/2022 FINDINGS: Brain: No acute intracranial abnormality. Specifically, no hemorrhage, hydrocephalus, mass lesion, acute infarction, or significant intracranial injury. Vascular: No hyperdense vessel or unexpected calcification. Skull: No acute calvarial abnormality. Sinuses/Orbits: No acute findings Other: None IMPRESSION: Normal study. Electronically Signed   By: Franky Crease M.D.   On: 03/17/2024 00:17   _______________________________________________________________________________________________________ Latest  Blood pressure 127/87, pulse 88, temperature 97.8 F (36.6 C), temperature source Oral, resp. rate 20, SpO2 95%.   Vitals  labs and radiology finding personally reviewed  Review of Systems:    Pertinent  positives include:  abdominal pain, nausea, vomiting, diarrhea,  Constitutional:  No weight loss, night sweats, Fevers, chills, fatigue, weight loss  HEENT:  No headaches, Difficulty swallowing,Tooth/dental problems,Sore throat,  No sneezing, itching, ear ache, nasal congestion, post nasal drip,  Cardio-vascular:  No chest pain, Orthopnea, PND, anasarca, dizziness, palpitations.no Bilateral lower extremity swelling  GI:  No heartburn, indigestion,  change in bowel habits, loss of appetite, melena, blood in stool, hematemesis Resp:  no shortness of breath at rest. No dyspnea on exertion, No excess mucus, no productive cough, No non-productive cough, No coughing up of blood.No change in color of mucus.No wheezing. Skin:  no rash or lesions. No jaundice GU:  no dysuria, change in color of urine, no urgency or frequency. No straining to urinate.  No flank pain.  Musculoskeletal:  No joint pain or no joint swelling. No decreased range of motion. No back pain.  Psych:  No change in mood or affect. No depression or anxiety. No memory loss.  Neuro: no localizing neurological complaints, no tingling, no weakness, no double vision, no gait abnormality, no slurred speech, no confusion  All systems reviewed and apart from HOPI all are negative _______________________________________________________________________________________________ Past Medical History:   Past Medical History:  Diagnosis Date   Hernia    Hernia       No past surgical history on file.  Social History:  Ambulatory   independently      reports that he has been smoking cigarettes. He has never used smokeless tobacco. He reports that he does not currently use alcohol. He reports that he does not use drugs.   Family History:   Family History  Problem Relation Age of Onset   Cancer Mother    ______________________________________________________________________________________________ Allergies: Allergies   Allergen Reactions   Amoxicillin Diarrhea, Itching and Nausea And Vomiting   Penicillins Itching, Nausea And Vomiting and Rash     Prior to Admission medications   Medication Sig Start Date End Date Taking? Authorizing Provider  benzonatate  (TESSALON ) 100 MG capsule Take 1 capsule (100 mg total) by mouth every 8 (eight) hours. 10/19/23   Emil Share, DO  clonazePAM  (KLONOPIN ) 1 MG tablet Take 1 tablet (1 mg total) by mouth 2 (two) times daily. 07/07/19   Maczis, Michael M, PA-C  clotrimazole  (LOTRIMIN ) 1 % cream Apply to affected area 2 times daily 09/14/23   Cardama, Raynell Moder, MD  ibuprofen  (ADVIL ) 800 MG tablet Take 1 tablet (800 mg total) by mouth every 8 (eight) hours as needed for moderate pain. 12/14/20   Long, Joshua G, MD  methocarbamol  (ROBAXIN ) 500 MG tablet Take 1 tablet (500 mg total) by mouth every 8 (eight) hours as needed for muscle spasms. 07/07/19  Maczis, Michael M, PA-C  naproxen  (NAPROSYN ) 375 MG tablet Take 1 tablet (375 mg total) by mouth 2 (two) times daily with a meal. 10/25/23   Harris, Abigail, PA-C  omeprazole  (PRILOSEC) 40 MG capsule Take 1 capsule (40 mg total) by mouth daily. 02/09/15   Ward, Josette SAILOR, DO  ondansetron  (ZOFRAN -ODT) 4 MG disintegrating tablet 4mg  ODT q4 hours prn nausea/vomit 10/19/23   Floyd, Dan, DO  oxyCODONE  (OXY IR/ROXICODONE ) 5 MG immediate release tablet Take 1 tablet (5 mg total) by mouth every 6 (six) hours as needed for breakthrough pain. 07/07/19   Maczis, Michael M, PA-C  promethazine  (PHENERGAN ) 25 MG tablet Take 1 tablet (25 mg total) by mouth every 6 (six) hours as needed for nausea or vomiting. 02/09/15   Ward, Josette SAILOR, DO  sucralfate  (CARAFATE ) 1 G tablet Take 1 tablet (1 g total) by mouth 4 (four) times daily -  with meals and at bedtime. 02/09/15   Ward, Josette SAILOR, DO    ___________________________________________________________________________________________________ Physical Exam:    03/16/2024    8:58 PM 10/25/2023    7:15 AM  10/25/2023    7:10 AM  Vitals with BMI  Height  5' 11   Weight  165 lbs   BMI  23.02   Systolic 127  145  Diastolic 87  93  Pulse 88  105     1. General:  in No  Acute distress actively vomiting on the floor in ER   Chronically ill   -appearing 2. Psychological: Alert and   Oriented 3. Head/ENT:    Dry Mucous Membranes                          Head Non traumatic, neck supple                          Poor Dentition 4. SKIN:  decreased Skin turgor,  Skin clean Dry and intact no rash    5. Heart: Regular rate and rhythm no  Murmur, no Rub or gallop 6. Lungs:   no wheezes or crackles   7. Abdomen: Soft,  non-tender, Non distended bowel sounds present 8. Lower extremities: no clubbing, cyanosis, no  edema 9. Neurologically Grossly intact, moving all 4 extremities equally    cranial nerves II through XII intact 10. MSK: Normal range of motion    Chart has been reviewed  ______________________________________________________________________________________________  Assessment/Plan  48 y.o. male with medical history significant of homelessness, etoh abuse in the past, hep c  Admitted for   AKI   Nausea and vomiting, unspecified vomiting type     Present on Admission:  AKI (acute kidney injury) (HCC)  Impaired mobility and activities of daily living  Elevated LFTs  Alcohol abuse  Dehydration  Leukocytosis  Nausea vomiting and diarrhea  AKI (acute kidney injury) (HCC) -  evidence of acute renal failure due to presence of following: Cr increased >0.3 from baseline  likely secondary to dehydration,        check FeNA       Rehydrate with IV fluids History does not suggest urinary retention or obstruction         If persists despite fluid resuscitation will need renal consult.    Impaired mobility and activities of daily living Will do pt /ot eval   Elevated LFTs In the setting of significant dehydration history of alcohol abuse.  Will check CK noncontrasted CT abdomen  unremarkable.  Will rehydrate and repeat obtain hepatitis serologies States has hx of HEp C   Alcohol abuse Monitor for any sign of alcohol withdrawal I will write for CIWA protocol Pt states has been off EtOH  But has been using occasional benzodiazepines Given unclear hx of risk for withdraw will monitor  Dehydration  rehydrate  Leukocytosis In the setting of significant dehydration and hemoconcentration at this point no evidence of infectious process will continue to monitor  Nausea vomiting and diarrhea After ingestion of potentially contaminated water Water gastric panel    Homelessness Transitional care consult    Other plan as per orders.  DVT prophylaxis:  SCD     Code Status:    Code Status: Prior FULL CODE  as per patient  I had personally discussed CODE STATUS with patient  ACP  none Family Communication:   Family not at  Bedside    Diet clear   Disposition Plan:      To home once workup is complete and patient is stable   Following barriers for discharge:                                                        Electrolytes corrected                                    Consult Orders  (From admission, onward)           Start     Ordered   03/17/24 0030  Consult to hospitalist  Once       Provider:  (Not yet assigned)  Question Answer Comment  Place call to: [facility]   Reason for Consult Admit      03/17/24 0030                               Would benefit from PT/OT eval prior to DC  Ordered                                     Transition of care consulted                   Nutrition    consulted                                     Consults called:    NONE   Admission status:  ED Disposition     ED Disposition  Admit   Condition  --   Comment  Hospital Area: [facility] [[id number]]  Level of Care: Progressive [102]  Admit to Progressive based on following criteria: NEPHROLOGY stable condition  requiring close monitoring for AKI, requiring Hemodialysis or Peritoneal Dialysis either from expected electrolyte imbalance, acidosis, or fluid overload that can be managed by NIPPV or high flow oxygen.  May admit patient to [facility] or [facility] if equivalent level of care is available:: No  Covid Evaluation: Asymptomatic - no recent exposure (last 10 days) testing not required  Diagnosis: AKI (  acute kidney injury) [facility]) [[id number]]  Admitting Physician: Nichola Warren [[id number]]  Attending Physician: Calvin Jablonowski [[id number]]  Certification:: I certify this patient will need inpatient services for at least 2 midnights  Expected Medical Readiness: 03/19/2024            inpatient     I Expect 2 midnight stay secondary to severity of patient's current illness need for inpatient interventions justified by the following:     Severe lab/radiological/exam abnormalities including:    AKI (acute kidney injury) (HCC)    Nausea and vomiting, unspecified vomiting type    and extensive comorbidities including:  substance abuse   That are currently affecting medical management.   I expect  patient to be hospitalized for 2 midnights requiring inpatient medical care.  Patient is at high risk for adverse outcome (such as loss of life or disability) if not treated.  Indication for inpatient stay as follows:   inability to maintain oral hydration   Need for   IV fluids,,      Level of care       progressive      Rilan Eiland 03/17/2024, 1:17 AM    [facility]     after 2 AM please page floor coverage   If 7AM-7PM, please contact the day team taking care of the patient using [email or web address]

## 2024-03-17 NOTE — Assessment & Plan Note (Signed)
 rehydrate

## 2024-03-17 NOTE — Assessment & Plan Note (Signed)
 After ingestion of potentially contaminated water Water gastric panel

## 2024-03-18 DIAGNOSIS — F1010 Alcohol abuse, uncomplicated: Secondary | ICD-10-CM | POA: Diagnosis not present

## 2024-03-18 DIAGNOSIS — Z5900 Homelessness unspecified: Secondary | ICD-10-CM | POA: Diagnosis not present

## 2024-03-18 DIAGNOSIS — R7989 Other specified abnormal findings of blood chemistry: Secondary | ICD-10-CM | POA: Diagnosis not present

## 2024-03-18 DIAGNOSIS — N179 Acute kidney failure, unspecified: Secondary | ICD-10-CM | POA: Diagnosis not present

## 2024-03-18 LAB — COMPREHENSIVE METABOLIC PANEL WITH GFR
ALT: 131 U/L — ABNORMAL HIGH (ref 0–44)
AST: 84 U/L — ABNORMAL HIGH (ref 15–41)
Albumin: 3.1 g/dL — ABNORMAL LOW (ref 3.5–5.0)
Alkaline Phosphatase: 95 U/L (ref 38–126)
Anion gap: 6 (ref 5–15)
BUN: 31 mg/dL — ABNORMAL HIGH (ref 6–20)
CO2: 22 mmol/L (ref 22–32)
Calcium: 8.4 mg/dL — ABNORMAL LOW (ref 8.9–10.3)
Chloride: 108 mmol/L (ref 98–111)
Creatinine, Ser: 1.03 mg/dL (ref 0.61–1.24)
GFR, Estimated: >60 mL/min (ref >60)
Glucose, Bld: 83 mg/dL (ref 70–99)
Potassium: 4.2 mmol/L (ref 3.5–5.1)
Sodium: 136 mmol/L (ref 135–145)
Total Bilirubin: 1 mg/dL (ref 0.0–1.2)
Total Protein: 6.5 g/dL (ref 6.5–8.1)

## 2024-03-18 LAB — HIV ANTIBODY (ROUTINE TESTING W REFLEX): HIV Screen 4th Generation wRfx: NONREACTIVE

## 2024-03-18 LAB — HCV RNA QUANT
HCV Quantitative Log: 6.877 {Log_IU}/mL (ref >1.70)
HCV Quantitative: 7540000 [IU]/mL (ref >50)

## 2024-03-18 LAB — PHOSPHORUS: Phosphorus: 1.9 mg/dL — ABNORMAL LOW (ref 2.5–4.6)

## 2024-03-18 LAB — CK: Total CK: 960 U/L — ABNORMAL HIGH (ref 49–397)

## 2024-03-18 MED ORDER — BOOST / RESOURCE BREEZE PO LIQD CUSTOM
1.0000 | Freq: Three times a day (TID) | ORAL | Status: DC
  Administered 2024-03-18 – 2024-03-19 (×4): 1 via ORAL
  Administered 2024-03-19: 237 mL via ORAL
  Administered 2024-03-20: 1 via ORAL

## 2024-03-18 MED ORDER — ENSURE PLUS HIGH PROTEIN PO LIQD: 237.0000 mL | Freq: Three times a day (TID) | ORAL | Status: DC

## 2024-03-18 MED ORDER — SODIUM PHOSPHATES 45 MMOLE/15ML IV SOLN
15.0000 mmol | Freq: Once | INTRAVENOUS | Status: AC
  Administered 2024-03-18: 15 mmol via INTRAVENOUS
  Filled 2024-03-18: qty 5

## 2024-03-18 NOTE — Progress Notes (Signed)
 Initial Nutrition Assessment  DOCUMENTATION CODES:   Severe malnutrition in context of social or environmental circumstances  INTERVENTION:  -Continue CL diet to be advanced as tolerated to regular menu -Add Boost Breeze TID -Add MVI, Thiamine , Folic acid  -Discussed importance of adequate kcal/pro needs once n/v improves  NUTRITION DIAGNOSIS:   Severe Malnutrition related to social / environmental circumstances as evidenced by moderate muscle depletion, moderate fat depletion.  GOAL:   Patient will meet greater than or equal to 90% of their needs   MONITOR:   PO intake, Skin, Supplement acceptance, Labs, Diet advancement, Weight trends  REASON FOR ASSESSMENT:   Consult Assessment of nutrition requirement/status  ASSESSMENT:   Hx homelessness, etoh abuse in the past and HEpC. Came in with epigastric pain, n,v. Yesterday he took what he thought was Xanax from his friend after taking 2 doses he started to have profuse nausea and vomiting as well as some diarrhea. [place] felt very weak and fell into the water. Patient feels like he may have drank some of the dirty water from the [facility].  Spoke to pt in room. Pt endorses nausea and vomiting, able to keep only some liquids down. Pt does state success with small amounts of water, and popsicle. Diarrhea also noted. Pt denies constipation and chewing/swallowing difficulties. Last BM 8/1. Pt denies weight loss, actually endorses weight gain from previous low. Unsure of what weight amounts exactly. Documented weights show significant weight loss of 17.4 kg (23.3%), which is severe. UBW prior to loss ~155 lbs, most recent weight 126 lbs. Recommend daily weights to assess baseline. Pt states poor appetite with n/v at this time, however, does have 1 meal documented, shows 100% consumed. States good appetite at baseline. Pt denies additional question/concerns at this time, will continue to monitor, RDN available prn.    Labs BUN 31 Calcium 8.4 Phos 1.9 Mg 2.5 Albumin  3.1 ST 84 ALT 131 Prealbumin 15 H/H 12.5/37.1  Medications  feeding supplement  237 mL Oral TID BM   folic acid   1 mg Oral Daily   multivitamin with minerals  1 tablet Oral Daily   pantoprazole   40 mg Oral Daily   sucralfate   1 g Oral TID WC & HS   thiamine   100 mg Oral Daily   Or   thiamine   100 mg Intravenous Daily     NUTRITION - FOCUSED PHYSICAL EXAM:  Flowsheet Row Most Recent Value  Orbital Region Mild depletion  Upper Arm Region Moderate depletion  Thoracic and Lumbar Region Moderate depletion  Buccal Region Mild depletion  Temple Region Moderate depletion  Clavicle Bone Region Moderate depletion  Clavicle and Acromion Bone Region Moderate depletion  Scapular Bone Region Moderate depletion  Dorsal Hand Mild depletion  Patellar Region Moderate depletion  Anterior Thigh Region Moderate depletion  Posterior Calf Region Moderate depletion  Edema (RD Assessment) None  Hair Reviewed  Eyes Reviewed  Mouth Reviewed  Skin Reviewed  Nails Reviewed    Diet Order:   Diet Order             Diet clear liquid Room service appropriate? Yes; Fluid consistency: Thin  Diet effective now                   EDUCATION NEEDS:   Education needs have been addressed  Skin:  Skin Assessment: Reviewed RN Assessment  Last BM:  8/1  Height:   Ht Readings from Last 1 Encounters:  03/17/24 5' 11 (  1.803 m)    Weight:   Wt Readings from Last 1 Encounters:  03/17/24 57.4 kg    BMI:  Body mass index is 17.65 kg/m.  Estimated Nutritional Needs:   Kcal:  1725-2000 kcal  Protein:  70-90 g  Fluid:  >/=2L   Paxton Binns Daml-Budig, RDN, LDN Registered Dietitian Nutritionist RD Inpatient Contact Info in [place]

## 2024-03-18 NOTE — Progress Notes (Signed)
 [facility]  PROGRESS NOTE  Brandon Flowers FMW:[id number] DOB: 01/08/1976 DOA: 03/16/2024 PCP: Patient, No Pcp Per   Brief HPI:   48 y.o. male with medical history significant of homelessness, etoh abuse in the past and HEpC     Presented with   N/V/D Came in with epigastric pain  N/V .  Yesterday he took what he thought was Xanax from his friend after taking 2 doses he started to have profuse nausea and vomiting as well as some diarrhea he was [place] he felt very weak and fell into the water patient feels like he may have drank some of the dirty water from the [facility] In the ER found to have creatinine of 3.16 elevated LFTs Elevated white blood cell count up to 18 CT abdomen pelvis and head unremarkable    Assessment/Plan:   AKI (acute kidney injury) (HCC) Resolved- -  evidence of acute renal failure due to presence of following: Cr increased >0.3 from baseline  likely secondary to dehydration,   - Presented with creatinine of 3.16      check FeNA  0.6%; consistent with prerenal etiology      Rehydrate with IV fluids Creatinine has improved to 1.03 -Continue aggressive hydration with IV normal saline at 125 mL/h -Follow renal function in a.m.     Impaired mobility and activities of daily living Will do pt /ot eval    Elevated LFTs In the setting of significant dehydration history of alcohol abuse.   noncontrasted CT abdomen unremarkable.  Will rehydrate and repeat obtain hepatitis serologies States has hx of HEp C -CK elevated at 1132     Alcohol abuse Continue CIWA protocol Pt states has been off EtOH  But has been using occasional benzodiazepines Given unclear hx of risk for withdraw will monitor   Hypophosphatemia -Phosphorus is 1.9 -Replace phosphorus .   Leukocytosis In the setting of significant dehydration and hemoconcentration at this point no evidence of infectious process will continue to monitor   Nausea vomiting and diarrhea After  ingestion of potentially contaminated water -Diarrhea has resolved.  Will discontinue enteric precautions.     Homelessness Transitional care consult      Medications     folic acid   1 mg Oral Daily   multivitamin with minerals  1 tablet Oral Daily   pantoprazole   40 mg Oral Daily   sucralfate   1 g Oral TID WC & HS   thiamine   100 mg Oral Daily   Or   thiamine   100 mg Intravenous Daily     Data Reviewed:   CBG:  No results for input(s): GLUCAP in the last 168 hours.  SpO2: 99 %    Vitals:   03/17/24 1809 03/17/24 2211 03/18/24 0224 03/18/24 0522  BP: 119/83 115/82 95/62 110/76  Pulse: (!) 59 71 (!) 55 (!) 50  Resp: 19     Temp: 98.4 F (36.9 C) 98.1 F (36.7 C) 97.9 F (36.6 C) 97.8 F (36.6 C)  TempSrc: Oral Oral Oral Oral  SpO2: 100% 98% 100% 99%  Weight:      Height:          Data Reviewed:  Basic Metabolic Panel: Recent Labs  Lab 03/16/24 2232 03/17/24 0139 03/17/24 0915 03/17/24 1743 03/18/24 0446  NA 133*  --  132* 134* 136  K 4.4  --  3.9 4.6 4.2  CL 96*  --  100 102 108  CO2 18*  --  19* 25 22  GLUCOSE 90  --  106* 90 83  BUN 58*  --  50* 39* 31*  CREATININE 3.16*  --  1.64* 1.33* 1.03  CALCIUM 9.7  --  8.4* 8.8* 8.4*  MG  --  2.8*  --  2.5*  --   PHOS  --  4.7*  --  1.7*  --     CBC: Recent Labs  Lab 03/16/24 2232 03/17/24 1743  WBC 17.8* 11.7*  NEUTROABS 15.2*  --   HGB 14.8 12.5*  HCT 44.5 37.1*  MCV 92.7 94.2  PLT 291 263    LFT Recent Labs  Lab 03/16/24 2232 03/17/24 0915 03/17/24 1743 03/18/24 0446  AST 131* 85* 86* 84*  ALT 260* 176* 157* 131*  ALKPHOS 157* 122 111 95  BILITOT 1.9* 1.2 1.2 1.0  PROT 10.2* 7.8 7.3 6.5  ALBUMIN  5.1* 3.7 3.5 3.1*     Antibiotics: Anti-infectives (From admission, onward)    Start     Dose/Rate Route Frequency Ordered Stop   03/16/24 2345  cefTRIAXone  (ROCEPHIN ) 2 g in sodium chloride  0.9 % 100 mL IVPB        2 g 200 mL/hr over 30 Minutes Intravenous  Once 03/16/24  2339 03/17/24 0148   03/16/24 2345  metroNIDAZOLE  (FLAGYL ) IVPB 500 mg        500 mg 100 mL/hr over 60 Minutes Intravenous  Once 03/16/24 2339 03/17/24 0148        DVT prophylaxis: SCDs  Code Status: Full code  Family Communication: No family at bedside   CONSULTS    Subjective   Denies diarrhea.  Continues to have nausea and vomiting   Objective    Physical Examination:   General-appears in no acute distress Heart-S1-S2, regular, no murmur auscultated Lungs-clear to auscultation bilaterally, no wheezing or crackles auscultated Abdomen-soft, nontender, no organomegaly Extremities-no edema in the lower extremities Neuro-alert, oriented x3, no focal deficit noted   Status is: Inpatient:             Brandon Flowers   [facility] If 7PM-7AM, please contact night-coverage at [email or web address], Office  [phone]   03/18/2024, 7:41 AM  LOS: 1 day

## 2024-03-18 NOTE — Evaluation (Signed)
 Physical Therapy Evaluation Patient Details Name: Brandon Flowers MRN: [id number] DOB: 02/27/1976 Today's Date: 03/18/2024  History of Present Illness  Brandon Flowers is a 48 y.o. male presents with nausea and vomiting; admitted with AKI. UTOX pos for cocaine and amphetamines. CT abdomen pelvis no acute findings, CT head normal study, Chest xray normal study. PMH:  etoh abuse in the past  Clinical Impression  Pt admitted with above diagnosis. 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 On eval, pt ind with bed mobility and transfers. Pt with narrow gait, intermittent HHA with hesitation, pt reporting nausea, abdominal discomfort and becomes pale, dizzy and and sweaty, though continues conversation - provided recliner and returned to room via recliner, BP 134/82 and pt reports improvement in symptoms. Plan to progress back to baseline, anticipate no f/u at d/c. Pt currently with functional limitations due to the deficits listed below (see PT Problem List). Pt will benefit from acute skilled PT to increase their independence and safety with mobility to allow discharge.           If plan is discharge home, recommend the following: Assistance with cooking/housework;Assist for transportation;Help with stairs or ramp for entrance   Can travel by private vehicle        Equipment Recommendations None recommended by PT  Recommendations for Other Services       Functional Status Assessment Patient has had a recent decline in their functional status and demonstrates the ability to make significant improvements in function in a reasonable and predictable amount of time.     Precautions / Restrictions Precautions Precautions: Fall Restrictions Weight Bearing Restrictions Per Provider Order: No      Mobility  Bed Mobility Overal bed mobility: Independent                   Transfers Overall transfer level: Independent Equipment used: None                    Ambulation/Gait Ambulation/Gait assistance: Contact guard assist Gait Distance (Feet): 100 Feet Assistive device: None, 1 person hand held assist Gait Pattern/deviations: Step-through pattern, Decreased stride length, Narrow base of support Gait velocity: decreased     General Gait Details: pt with step through gait pattern, narrow BOS, hesitant step progression wtih decreased cadence, intermittent HHA, ultimately needing seated rest break due to dizziness, pale, sweaty, though still conversational - BP 134/82  Stairs            Wheelchair Mobility     Tilt Bed    Modified Rankin (Stroke Patients Only)       Balance Overall balance assessment: Mild deficits observed, not formally tested                                           Pertinent Vitals/Pain Pain Assessment Pain Assessment: Faces Faces Pain Scale: Hurts a little bit Pain Location: abdomen Pain Descriptors / Indicators:  (nauseas) Pain Intervention(s): Limited activity within patient's tolerance, Monitored during session, Premedicated before session, Repositioned    Home Living Family/patient expects to be discharged to:: Private residence Living Arrangements: Non-relatives/Friends Available Help at Discharge: Friend(s) Type of Home: House       Alternate Level Stairs-Number of Steps: flight Home Layout: Two level;Bed/bath upstairs Home Equipment:  None      Prior Function Prior Level of Function : Independent/Modified Independent;Working/employed             Mobility Comments: 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, 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 ADLs Comments: 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     Extremity/Trunk Assessment   Upper Extremity Assessment Upper Extremity Assessment: Defer to OT evaluation    Lower Extremity Assessment Lower Extremity Assessment: Overall WFL for  tasks assessed    Cervical / Trunk Assessment Cervical / Trunk Assessment: Normal  Communication   Communication Communication: No apparent difficulties    Cognition Arousal: Alert Behavior During Therapy: WFL for tasks assessed/performed   PT - Cognitive impairments: No apparent impairments                       PT - Cognition Comments: pt pleasant, follows commands Following commands: Intact       Cueing       General Comments      Exercises     Assessment/Plan    PT Assessment Patient needs continued PT services  PT Problem List Decreased activity tolerance;Decreased balance;Pain       PT Treatment Interventions DME instruction;Gait training;Stair training;Functional mobility training;Therapeutic activities;Therapeutic exercise;Balance training;Patient/family education    PT Goals (Current goals can be found in the Care Plan section)  Acute Rehab PT Goals Patient Stated Goal: return home PT Goal Formulation: With patient Time For Goal Achievement: 04/01/24 Potential to Achieve Goals: Good    Frequency Min 3X/week     Co-evaluation               AM-PAC PT 6 Clicks Mobility  Outcome Measure Help needed turning from your back to your side while in a flat bed without using bedrails?: None Help needed moving from lying on your back to sitting on the side of a flat bed without using bedrails?: None Help needed moving to and from a bed to a chair (including a wheelchair)?: None Help needed standing up from a chair using your arms (e.g., wheelchair or bedside chair)?: None Help needed to walk in hospital room?: A Little Help needed climbing 3-5 steps with a railing? : A Little 6 Click Score: 22    End of Session Equipment Utilized During Treatment: Gait belt Activity Tolerance: Patient tolerated treatment well Patient left: in chair;with call bell/phone within reach;with chair alarm set Nurse Communication: Mobility status;Other (comment)  (vitals) PT Visit Diagnosis: Unsteadiness on feet (R26.81)    Time: 9056-9042 PT Time Calculation (min) (ACUTE ONLY): 14 min   Charges:   PT Evaluation $PT Eval Low Complexity: 1 Low   PT General Charges $$ ACUTE PT VISIT: 1 Visit          Tori Tamasha Laplante PT, DPT 03/18/24, 10:12 AM

## 2024-03-18 NOTE — Evaluation (Signed)
 Occupational Therapy Evaluation Patient Details Name: Brandon Flowers MRN: [id number] DOB: 03-Feb-1976 Today's Date: 03/18/2024   History of Present Illness   Brandon Flowers is a 48 y.o. male presents with nausea and vomiting; admitted with AKI. UTOX pos for cocaine and amphetamines. CT abdomen pelvis no acute findings, CT head normal study, Chest xray normal study. PMH:  etoh abuse in the past     Clinical Impressions Pt presents with decline in function nd safety with ADLs and ADL mobility with impaired balance and endurance. Pt reports no dizziness as with earlier PT session. PTA, pt reports that he was INd with ADls, IADLs, mobility and works. Pt currently Sup for and  a little unsteady with LB ADLs sit - stand, toilet and shower transfers but no physical assist required. OT will follow acutely to maximize level of function and safety     If plan is discharge home, recommend the following:   Assistance with cooking/housework;Assist for transportation;Help with stairs or ramp for entrance     Functional Status Assessment   Patient has had a recent decline in their functional status and demonstrates the ability to make significant improvements in function in a reasonable and predictable amount of time.     Equipment Recommendations   None recommended by OT     Recommendations for Other Services         Precautions/Restrictions   Precautions Precautions: Fall Restrictions Weight Bearing Restrictions Per Provider Order: No     Mobility Bed Mobility               General bed mobility comments: pt in chair    Transfers Overall transfer level: Independent Equipment used: None                      Balance Overall balance assessment: Mild deficits observed, not formally tested                                         ADL either performed or assessed with clinical judgement   ADL Overall ADL's : Needs  assistance/impaired Eating/Feeding: Independent   Grooming: Wash/dry hands;Wash/dry face;Standing   Upper Body Bathing: Independent   Lower Body Bathing: Contact guard assist;Supervison/ safety;Sit to/from stand   Upper Body Dressing : Independent   Lower Body Dressing: Contact guard assist;Supervision/safety;Sit to/from stand   Toilet Transfer: Supervision/safety   Toileting- Architect and Hygiene: Independent   Retail buyer: Supervision/safety   Functional mobility during ADLs: Supervision/safety;Independent       Vision Ability to See in Adequate Light: 0 Adequate Patient Visual Report: No change from baseline       Perception         Praxis         Pertinent Vitals/Pain Pain Assessment Pain Assessment: No/denies pain Pain Score: 0-No pain     Extremity/Trunk Assessment Upper Extremity Assessment Upper Extremity Assessment: Overall WFL for tasks assessed   Lower Extremity Assessment Lower Extremity Assessment: Defer to PT evaluation   Cervical / Trunk Assessment Cervical / Trunk Assessment: Normal   Communication Communication Communication: No apparent difficulties   Cognition Arousal: Alert Behavior During Therapy: WFL for tasks assessed/performed                                 Following commands: Intact  Cueing  General Comments          Exercises     Shoulder Instructions      Home Living Family/patient expects to be discharged to:: Private residence Living Arrangements: Non-relatives/Friends Available Help at Discharge: Friend(s) Type of Home: House       Home Layout: Two level;Bed/bath upstairs Alternate Level Stairs-Number of Steps: flight   Bathroom Shower/Tub: Tub/shower unit;Walk-in shower         Home Equipment: None          Prior Functioning/Environment Prior Level of Function : Independent/Modified Independent;Working/employed             Mobility Comments: 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, 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 ADLs Comments: Pt reports INd    OT Problem List: Decreased activity tolerance;Impaired balance (sitting and/or standing)   OT Treatment/Interventions: Self-care/ADL training;Patient/family education;Balance training;Therapeutic activities      OT Goals(Current goals can be found in the care plan section)   Acute Rehab OT Goals Patient Stated Goal: feel better, go home OT Goal Formulation: With patient Time For Goal Achievement: 04/01/24 Potential to Achieve Goals: Good ADL Goals Pt Will Perform Lower Body Bathing: with supervision;with modified independence;sit to/from stand Pt Will Perform Lower Body Dressing: with supervision;with modified independence;sit to/from stand Pt Will Transfer to Toilet: with modified independence;ambulating Pt Will Perform Tub/Shower Transfer: with modified independence;ambulating   OT Frequency:  Min 2X/week    Co-evaluation              AM-PAC OT 6 Clicks Daily Activity     Outcome Measure Help from another person eating meals?: None Help from another person taking care of personal grooming?: None Help from another person toileting, which includes using toliet, bedpan, or urinal?: A Little Help from another person bathing (including washing, rinsing, drying)?: A Lot Help from another person to put on and taking off regular upper body clothing?: None Help from another person to put on and taking off regular lower body clothing?: A Lot 6 Click Score: 19   End of Session Nurse Communication: Mobility status  Activity Tolerance: Patient tolerated treatment well Patient left: in chair;with call bell/phone within reach;with chair alarm set  OT Visit Diagnosis: Unsteadiness on feet (R26.81)                Time: 8960-8897 OT Time Calculation (min): 23 min Charges:  OT General Charges $OT Visit: 1 Visit OT Evaluation $OT Eval Low Complexity: 1 Low OT  Treatments $Self Care/Home Management : 8-22 mins    Jacques Karna Loose 03/18/2024, 1:20 PM

## 2024-03-19 ENCOUNTER — Inpatient Hospital Stay (HOSPITAL_COMMUNITY)

## 2024-03-19 DIAGNOSIS — F1010 Alcohol abuse, uncomplicated: Secondary | ICD-10-CM | POA: Diagnosis not present

## 2024-03-19 DIAGNOSIS — N179 Acute kidney failure, unspecified: Secondary | ICD-10-CM | POA: Diagnosis not present

## 2024-03-19 DIAGNOSIS — E43 Unspecified severe protein-calorie malnutrition: Secondary | ICD-10-CM | POA: Insufficient documentation

## 2024-03-19 DIAGNOSIS — Z5900 Homelessness unspecified: Secondary | ICD-10-CM | POA: Diagnosis not present

## 2024-03-19 DIAGNOSIS — R7989 Other specified abnormal findings of blood chemistry: Secondary | ICD-10-CM | POA: Diagnosis not present

## 2024-03-19 LAB — COMPREHENSIVE METABOLIC PANEL WITH GFR
ALT: 110 U/L — ABNORMAL HIGH (ref 0–44)
AST: 74 U/L — ABNORMAL HIGH (ref 15–41)
Albumin: 2.9 g/dL — ABNORMAL LOW (ref 3.5–5.0)
Alkaline Phosphatase: 87 U/L (ref 38–126)
Anion gap: 5 (ref 5–15)
BUN: 13 mg/dL (ref 6–20)
CO2: 25 mmol/L (ref 22–32)
Calcium: 8.4 mg/dL — ABNORMAL LOW (ref 8.9–10.3)
Chloride: 103 mmol/L (ref 98–111)
Creatinine, Ser: 0.74 mg/dL (ref 0.61–1.24)
GFR, Estimated: >60 mL/min (ref >60)
Glucose, Bld: 84 mg/dL (ref 70–99)
Potassium: 4.1 mmol/L (ref 3.5–5.1)
Sodium: 133 mmol/L — ABNORMAL LOW (ref 135–145)
Total Bilirubin: 0.7 mg/dL (ref 0.0–1.2)
Total Protein: 6.5 g/dL (ref 6.5–8.1)

## 2024-03-19 LAB — PHOSPHORUS: Phosphorus: 2.1 mg/dL — ABNORMAL LOW (ref 2.5–4.6)

## 2024-03-19 LAB — VITAMIN B1: Vitamin B1 (Thiamine): 158.8 nmol/L (ref 66.5–200.0)

## 2024-03-19 MED ORDER — SODIUM PHOSPHATES 45 MMOLE/15ML IV SOLN
15.0000 mmol | Freq: Once | INTRAVENOUS | Status: AC
  Administered 2024-03-19: 15 mmol via INTRAVENOUS
  Filled 2024-03-19: qty 5
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
## 2024-03-20 DIAGNOSIS — R7989 Other specified abnormal findings of blood chemistry: Secondary | ICD-10-CM | POA: Diagnosis not present

## 2024-03-20 DIAGNOSIS — N179 Acute kidney failure, unspecified: Secondary | ICD-10-CM | POA: Diagnosis not present

## 2024-03-20 DIAGNOSIS — R112 Nausea with vomiting, unspecified: Secondary | ICD-10-CM | POA: Diagnosis not present

## 2024-03-20 DIAGNOSIS — F1010 Alcohol abuse, uncomplicated: Secondary | ICD-10-CM | POA: Diagnosis not present

## 2024-03-20 MED ORDER — PANTOPRAZOLE SODIUM 40 MG PO TBEC: 40.0000 mg | DELAYED_RELEASE_TABLET | Freq: Every day | ORAL | 1 refills | Status: AC

## 2024-03-20 MED ORDER — VITAMIN B-1 100 MG PO TABS: 100.0000 mg | ORAL_TABLET | Freq: Every day | ORAL | 1 refills | Status: AC
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
## 2024-03-20 NOTE — Discharge Instructions (Addendum)
 Follow-up with neurosurgery as outpatient.  Your PCP can make referral for neck pain.  [facility]
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
## 2024-03-22 LAB — CULTURE, BLOOD (ROUTINE X 2)
Culture: NO GROWTH
Culture: NO GROWTH

## 2024-03-26 ENCOUNTER — Encounter (HOSPITAL_COMMUNITY): Payer: Self-pay | Admitting: Emergency Medicine

## 2024-03-26 ENCOUNTER — Emergency Department (HOSPITAL_COMMUNITY)
Admission: EM | Admit: 2024-03-26 | Discharge: 2024-03-26 | Discharge status: Against Medical Advice | Attending: Emergency Medicine | Admitting: Emergency Medicine

## 2024-03-26 ENCOUNTER — Emergency Department (HOSPITAL_COMMUNITY)

## 2024-03-26 DIAGNOSIS — F17210 Nicotine dependence, cigarettes, uncomplicated: Secondary | ICD-10-CM | POA: Insufficient documentation

## 2024-03-26 DIAGNOSIS — R0789 Other chest pain: Secondary | ICD-10-CM | POA: Insufficient documentation

## 2024-03-26 LAB — CBC
HCT: 39.2 % (ref 39.0–52.0)
Hemoglobin: 12.8 g/dL — ABNORMAL LOW (ref 13.0–17.0)
MCH: 31.2 pg (ref 26.0–34.0)
MCHC: 32.7 g/dL (ref 30.0–36.0)
MCV: 95.6 fL (ref 80.0–100.0)
Platelets: 325 K/uL (ref 150–400)
RBC: 4.1 MIL/uL — ABNORMAL LOW (ref 4.22–5.81)
RDW: 14.3 % (ref 11.5–15.5)
WBC: 8.7 K/uL (ref 4.0–10.5)
nRBC: 0 % (ref 0.0–0.2)

## 2024-03-26 LAB — BASIC METABOLIC PANEL WITH GFR
Anion gap: 10 (ref 5–15)
BUN: 22 mg/dL — ABNORMAL HIGH (ref 6–20)
CO2: 25 mmol/L (ref 22–32)
Calcium: 9.5 mg/dL (ref 8.9–10.3)
Chloride: 101 mmol/L (ref 98–111)
Creatinine, Ser: 1.31 mg/dL — ABNORMAL HIGH (ref 0.61–1.24)
GFR, Estimated: >60 mL/min (ref >60)
Glucose, Bld: 70 mg/dL (ref 70–99)
Potassium: 4 mmol/L (ref 3.5–5.1)
Sodium: 136 mmol/L (ref 135–145)

## 2024-03-26 LAB — TROPONIN I (HIGH SENSITIVITY): Troponin I (High Sensitivity): 6 ng/L (ref <18)

## 2024-03-26 MED ORDER — SODIUM CHLORIDE 0.9 % IV BOLUS: 1000.0000 mL | Freq: Once | INTRAVENOUS | Status: DC
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
## 2024-03-26 NOTE — ED Notes (Signed)
 RN to room to draw repeat trop and administer fluids. Pt refused stating he felt better and was ready to go home. Advised him to finish work up and the risks of leaving at this time. Pt continued to ask to leave.MD notified. AMA signed. IV removed.
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
